# Patient Record
Sex: Male | Born: 1985 | Race: Black or African American | Hispanic: No | Smoking: Never smoker
Health system: Southern US, Community
[De-identification: ages and names within clinical notes are randomized; demographics above are authoritative.]

## PROBLEM LIST (undated history)

## (undated) DIAGNOSIS — F329 Major depressive disorder, single episode, unspecified: Secondary | ICD-10-CM

## (undated) DIAGNOSIS — F32A Depression, unspecified: Secondary | ICD-10-CM

## (undated) DIAGNOSIS — J302 Other seasonal allergic rhinitis: Secondary | ICD-10-CM

## (undated) DIAGNOSIS — F419 Anxiety disorder, unspecified: Secondary | ICD-10-CM

---
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
## 2004-04-05 ENCOUNTER — Emergency Department (HOSPITAL_COMMUNITY): Admission: EM | Admit: 2004-04-05 | Discharge: 2004-04-05 | Payer: Self-pay | Admitting: Emergency Medicine

## 2004-08-27 ENCOUNTER — Emergency Department (HOSPITAL_COMMUNITY): Admission: EM | Admit: 2004-08-27 | Discharge: 2004-08-27 | Payer: Self-pay | Admitting: Emergency Medicine

## 2004-10-05 ENCOUNTER — Emergency Department (HOSPITAL_COMMUNITY): Admission: EM | Admit: 2004-10-05 | Discharge: 2004-10-05 | Payer: Self-pay | Admitting: Emergency Medicine

## 2004-11-12 ENCOUNTER — Emergency Department (HOSPITAL_COMMUNITY): Admission: EM | Admit: 2004-11-12 | Discharge: 2004-11-12 | Payer: Self-pay | Admitting: Emergency Medicine

## 2004-11-16 ENCOUNTER — Ambulatory Visit (HOSPITAL_COMMUNITY): Admission: RE | Admit: 2004-11-16 | Discharge: 2004-11-16 | Payer: Self-pay | Admitting: Orthopedic Surgery

## 2004-11-16 ENCOUNTER — Ambulatory Visit (HOSPITAL_BASED_OUTPATIENT_CLINIC_OR_DEPARTMENT_OTHER): Admission: RE | Admit: 2004-11-16 | Discharge: 2004-11-16 | Payer: Self-pay | Admitting: Orthopedic Surgery

## 2004-11-23 ENCOUNTER — Emergency Department (HOSPITAL_COMMUNITY): Admission: EM | Admit: 2004-11-23 | Discharge: 2004-11-23 | Payer: Self-pay | Admitting: *Deleted

## 2010-03-09 DEATH — deceased

## 2011-04-12 ENCOUNTER — Emergency Department (HOSPITAL_COMMUNITY): Payer: Self-pay

## 2011-04-12 ENCOUNTER — Encounter: Payer: Self-pay | Admitting: Emergency Medicine

## 2011-04-12 ENCOUNTER — Emergency Department (HOSPITAL_COMMUNITY)
Admission: EM | Admit: 2011-04-12 | Discharge: 2011-04-12 | Disposition: A | Discharge status: Home / Self Care | Payer: Self-pay | Attending: Emergency Medicine | Admitting: Emergency Medicine

## 2011-04-12 DIAGNOSIS — R059 Cough, unspecified: Secondary | ICD-10-CM | POA: Insufficient documentation

## 2011-04-12 DIAGNOSIS — R0602 Shortness of breath: Secondary | ICD-10-CM | POA: Insufficient documentation

## 2011-04-12 DIAGNOSIS — J45901 Unspecified asthma with (acute) exacerbation: Secondary | ICD-10-CM | POA: Insufficient documentation

## 2011-04-12 DIAGNOSIS — R509 Fever, unspecified: Secondary | ICD-10-CM | POA: Insufficient documentation

## 2011-04-12 DIAGNOSIS — J40 Bronchitis, not specified as acute or chronic: Secondary | ICD-10-CM | POA: Insufficient documentation

## 2011-04-12 DIAGNOSIS — R05 Cough: Secondary | ICD-10-CM | POA: Insufficient documentation

## 2011-04-12 MED ORDER — PREDNISONE 20 MG PO TABS
60.0000 mg | ORAL_TABLET | Freq: Once | ORAL | Status: AC
  Administered 2011-04-12: 60 mg via ORAL
  Filled 2011-04-12: qty 3

## 2011-04-12 MED ORDER — AZITHROMYCIN 250 MG PO TABS: ORAL_TABLET | ORAL | Status: DC

## 2011-04-12 MED ORDER — AZITHROMYCIN 250 MG PO TABS
500.0000 mg | ORAL_TABLET | Freq: Once | ORAL | Status: AC
  Administered 2011-04-12: 500 mg via ORAL
  Filled 2011-04-12: qty 2

## 2011-04-12 MED ORDER — PREDNISONE 20 MG PO TABS: 40.0000 mg | ORAL_TABLET | Freq: Every day | ORAL | Status: DC

## 2011-04-12 MED ORDER — IPRATROPIUM BROMIDE 0.02 % IN SOLN
0.5000 mg | Freq: Once | RESPIRATORY_TRACT | Status: AC
  Administered 2011-04-12: 0.5 mg via RESPIRATORY_TRACT
  Filled 2011-04-12: qty 2.5

## 2011-04-12 MED ORDER — ALBUTEROL SULFATE (5 MG/ML) 0.5% IN NEBU
5.0000 mg | INHALATION_SOLUTION | Freq: Once | RESPIRATORY_TRACT | Status: AC
  Administered 2011-04-12: 5 mg via RESPIRATORY_TRACT
  Filled 2011-04-12: qty 1

## 2011-04-12 NOTE — ED Notes (Signed)
Pt is stable pt states he wants to go to see if he feels better

## 2011-04-12 NOTE — ED Provider Notes (Signed)
History     CSN: [id number]  Arrival date & time 04/12/11  1748   First MD Initiated Contact with Patient 04/12/11 2139      Chief Complaint  Patient presents with  . Asthma    (Consider location/radiation/quality/duration/timing/severity/associated sxs/prior treatment) HPI Comments: Patient here with worsening asthma and cough - denies fever or chills - reports worse with lying flat and exertion - denies chest pain - has been using albuterol inhaler without relief.  Patient is a 26 y.o. male presenting with asthma. The history is provided by the patient. No language interpreter was used.  Asthma This is a new problem. The current episode started in the past 7 days. The problem occurs constantly. The problem has been unchanged. Associated symptoms include congestion and coughing. Pertinent negatives include no abdominal pain, arthralgias, chest pain, chills, diaphoresis, fatigue, fever, myalgias, nausea, neck pain, numbness, rash, sore throat, swollen glands, visual change, vomiting or weakness. The symptoms are aggravated by nothing. He has tried nothing for the symptoms. The treatment provided no relief.    Past Medical History  Diagnosis Date  . Asthma     Past Surgical History  Procedure Date  . Appendectomy     Family History  Problem Relation Age of Onset  . Diabetes Other   . Asthma Other     History  Substance Use Topics  . Smoking status: Never Smoker   . Smokeless tobacco: Not on file  . Alcohol Use: No      Review of Systems  Constitutional: Negative for fever, chills, diaphoresis and fatigue.  HENT: Positive for congestion. Negative for sore throat and neck pain.   Respiratory: Positive for cough.   Cardiovascular: Negative for chest pain.  Gastrointestinal: Negative for nausea, vomiting and abdominal pain.  Musculoskeletal: Negative for myalgias and arthralgias.  Skin: Negative for rash.  Neurological: Negative for weakness and numbness.  All other  systems reviewed and are negative.    Allergies  Shellfish allergy  Home Medications   Current Outpatient Rx  Name Route Sig Dispense Refill  . ALBUTEROL SULFATE HFA 108 (90 BASE) MCG/ACT IN AERS Inhalation Inhale 2 puffs into the lungs every 6 (six) hours as needed. For shortness of breath.       BP 120/79  Pulse 88  Temp(Src) 98.8 F (37.1 C) (Oral)  Resp 20  Wt 170 lb (77.111 kg)  SpO2 96%  Physical Exam  Nursing note and vitals reviewed. Constitutional: He is oriented to person, place, and time. He appears well-developed and well-nourished. No distress.  HENT:  Head: Normocephalic and atraumatic.  Right Ear: External ear normal.  Left Ear: External ear normal.  Nose: Nose normal.  Mouth/Throat: Oropharynx is clear and moist. No oropharyngeal exudate.  Eyes: Conjunctivae are normal. Pupils are equal, round, and reactive to light. No scleral icterus.  Neck: Normal range of motion. Neck supple.  Cardiovascular: Normal rate, regular rhythm and normal heart sounds.  Exam reveals no gallop and no friction rub.   No murmur heard. Pulmonary/Chest: Effort normal and breath sounds normal. No respiratory distress. He has no wheezes. He has no rales. He exhibits no tenderness.       Prolonged expiration  Abdominal: Soft. Bowel sounds are normal. He exhibits no distension. There is no tenderness.  Musculoskeletal: Normal range of motion.  Lymphadenopathy:    He has no cervical adenopathy.  Neurological: He is alert and oriented to person, place, and time. He exhibits normal muscle tone.  Skin: Skin is warm  and dry.  Psychiatric: He has a normal mood and affect. His behavior is normal. Judgment and thought content normal.    ED Course  Procedures (including critical care time)  Labs Reviewed - No data to display Dg Chest 2 View  04/12/2011  *RADIOLOGY REPORT*  Clinical Data: Cough, fever, shortness of breath.  CHEST - 2 VIEW  Comparison: None  Findings: Diffuse peribronchial  thickening. Heart and mediastinal contours are within normal limits.  No focal opacities or effusions.  No acute bony abnormality.  IMPRESSION: Bronchitic changes.  Original Report Authenticated By: Cyndie Chime, M.D.     Bronchitis Asthma exacerbation    MDM  Mild improvement in symptoms after breathing treatment - noted with bronchitis on x-ray - will treat for same - also started on prednisone - good air movement and no wheezing noted after treatment.        Izola Price [place], [place] 04/12/11 2252

## 2011-04-12 NOTE — ED Notes (Signed)
Pt states he has been having problems with his asthma for the past 3 days  Pt states as long as he is at rest he is fine but any kind of exertion causes him to become short of breath and he starts coughing  Pt states he cannot lay flat or it is hard to breathe  Pt states he has been using his albuterol inhaler without relief

## 2011-04-13 NOTE — ED Provider Notes (Signed)
Medical screening examination/treatment/procedure(s) were performed by non-physician practitioner and as supervising physician I was immediately available for consultation/collaboration.  Dione Booze, MD 04/13/11 [phone]

## 2011-04-16 ENCOUNTER — Encounter (HOSPITAL_COMMUNITY): Payer: Self-pay

## 2011-04-16 ENCOUNTER — Emergency Department (HOSPITAL_COMMUNITY)
Admission: EM | Admit: 2011-04-16 | Discharge: 2011-04-16 | Disposition: A | Discharge status: Home / Self Care | Payer: Self-pay | Attending: Emergency Medicine | Admitting: Emergency Medicine

## 2011-04-16 DIAGNOSIS — R059 Cough, unspecified: Secondary | ICD-10-CM | POA: Insufficient documentation

## 2011-04-16 DIAGNOSIS — J45901 Unspecified asthma with (acute) exacerbation: Secondary | ICD-10-CM | POA: Insufficient documentation

## 2011-04-16 DIAGNOSIS — R0981 Nasal congestion: Secondary | ICD-10-CM

## 2011-04-16 DIAGNOSIS — J3489 Other specified disorders of nose and nasal sinuses: Secondary | ICD-10-CM | POA: Insufficient documentation

## 2011-04-16 DIAGNOSIS — R05 Cough: Secondary | ICD-10-CM | POA: Insufficient documentation

## 2011-04-16 MED ORDER — FLUTICASONE PROPIONATE 50 MCG/ACT NA SUSP: 2.0000 | Freq: Every day | NASAL | Status: DC

## 2011-04-16 MED ORDER — PSEUDOEPHEDRINE-IBUPROFEN 30-200 MG PO TABS: ORAL_TABLET | ORAL | Status: DC

## 2011-04-16 NOTE — ED Notes (Signed)
Pt was seen on the 3rd for the same sx, no relief with medications

## 2011-04-16 NOTE — ED Provider Notes (Signed)
Medical screening examination/treatment/procedure(s) were performed by non-physician practitioner and as supervising physician I was immediately available for consultation/collaboration.  Cyndra Numbers, MD 04/16/11 [phone]

## 2011-04-16 NOTE — ED Provider Notes (Signed)
History     CSN: [id number]  Arrival date & time 04/16/11  1232   None     Chief Complaint  Patient presents with  . Bronchitis    (Consider location/radiation/quality/duration/timing/severity/associated sxs/prior treatment) HPI  Patient presents to ER complaining of a 3 week hx of cough and asthma flare as well as nasal congestion stating he was seen in the ER prior and treated with prednisone, albuterol and Zpack stating he had temporary relief of symptoms but states that over the last few days increasing nasal congestion stating that when he lies down flat he feels like "no nose stops up closed." patient stating ongoing waxing and waning cough and wheeze with relief of wheezing with albuterol inhaler use. Denies changing or symptoms. Symptoms are persistent and unchanging. Denies fevers, chills, HA, sore throat, CP, abdominal pain, n/v/d.    Past Medical History  Diagnosis Date  . Asthma     Past Surgical History  Procedure Date  . Appendectomy     Family History  Problem Relation Age of Onset  . Diabetes Other   . Asthma Other     History  Substance Use Topics  . Smoking status: Never Smoker   . Smokeless tobacco: Not on file  . Alcohol Use: No      Review of Systems  All other systems reviewed and are negative.    Allergies  Shellfish allergy  Home Medications   Current Outpatient Rx  Name Route Sig Dispense Refill  . ALBUTEROL SULFATE HFA 108 (90 BASE) MCG/ACT IN AERS Inhalation Inhale 2 puffs into the lungs every 6 (six) hours as needed. For shortness of breath.     . AZITHROMYCIN 250 MG PO TABS  1 tablet po daily x 4 days 4 each 0  . FLUTICASONE PROPIONATE 50 MCG/ACT NA SUSP Nasal Place 2 sprays into the nose daily. 16 g 2  . PREDNISONE 20 MG PO TABS Oral Take 2 tablets (40 mg total) by mouth daily. 10 tablet 0  . PSEUDOEPHEDRINE-IBUPROFEN 30-200 MG PO TABS  Take as directed by Over the Counter instructions. 84 each 0    BP 117/75  Pulse 92   Temp(Src) 98.4 F (36.9 C) (Oral)  Resp 16  SpO2 98%  Physical Exam  Vitals reviewed. Constitutional: He is oriented to person, place, and time. He appears well-developed and well-nourished. No distress.  HENT:  Head: Normocephalic and atraumatic.  Right Ear: External ear normal.  Left Ear: External ear normal.  Nose: Nose normal.  Mouth/Throat: No oropharyngeal exudate.       Mild erythema of posterior pharynx and tonsils no tonsillar exudate or enlargement. Patent airway. Swallowing secretions well  Boggy nasal mucosa bilaterally.  Eyes: Conjunctivae and EOM are normal. Pupils are equal, round, and reactive to light.  Neck: Normal range of motion. Neck supple.  Cardiovascular: Normal rate, regular rhythm and normal heart sounds.  Exam reveals no gallop and no friction rub.   No murmur heard. Pulmonary/Chest: Effort normal and breath sounds normal. No respiratory distress. He has no wheezes. He has no rales. He exhibits no tenderness.  Abdominal: Soft. He exhibits no distension and no mass. There is no tenderness. There is no rebound and no guarding.  Lymphadenopathy:    He has no cervical adenopathy.  Neurological: He is alert and oriented to person, place, and time. He has normal reflexes.  Skin: Skin is warm and dry. No rash noted. He is not diaphoretic.  Psychiatric: He has a normal mood and  affect.    ED Course  Procedures (including critical care time)  Labs Reviewed - No data to display No results found.   1. Nasal congestion   2. Asthma       MDM  Patient describing "stopped up nose" causing SOB but denies CP or difficulty breathing with normal lung exam and pulse ox 98% on room air. Chest xray from start of symptoms three weeks ago shows no PNA or acute findings.         [place], [place] 04/16/11 1521

## 2011-12-22 ENCOUNTER — Emergency Department (HOSPITAL_COMMUNITY)
Admission: EM | Admit: 2011-12-22 | Discharge: 2011-12-22 | Disposition: A | Discharge status: Home Health | Payer: No Typology Code available for payment source | Attending: Emergency Medicine | Admitting: Emergency Medicine

## 2011-12-22 ENCOUNTER — Emergency Department (HOSPITAL_COMMUNITY): Payer: No Typology Code available for payment source

## 2011-12-22 DIAGNOSIS — Z043 Encounter for examination and observation following other accident: Secondary | ICD-10-CM | POA: Insufficient documentation

## 2011-12-22 DIAGNOSIS — M25539 Pain in unspecified wrist: Secondary | ICD-10-CM | POA: Insufficient documentation

## 2011-12-22 DIAGNOSIS — M25531 Pain in right wrist: Secondary | ICD-10-CM

## 2011-12-22 DIAGNOSIS — J45909 Unspecified asthma, uncomplicated: Secondary | ICD-10-CM | POA: Insufficient documentation

## 2011-12-22 MED ORDER — IBUPROFEN 800 MG PO TABS: 800.0000 mg | ORAL_TABLET | Freq: Three times a day (TID) | ORAL | Status: AC

## 2011-12-22 MED ORDER — OXYCODONE-ACETAMINOPHEN 5-325 MG PO TABS: 1.0000 | ORAL_TABLET | Freq: Four times a day (QID) | ORAL | Status: DC | PRN

## 2011-12-22 NOTE — ED Notes (Signed)
Pt given paper scrub top to wear.  Pt instructed to follow up with ortho in 2 days
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
## 2011-12-22 NOTE — Progress Notes (Signed)
Orthopedic Tech Progress Note Patient Details:  Maurice Koch 1986-03-04 [id number]  Ortho Devices Type of Ortho Device: Thumb spica splint Splint Material: Plaster Ortho Device/Splint Location: right hand Ortho Device/Splint Interventions: Application   Nikki Dom 12/22/2011, 5:49 PM

## 2011-12-22 NOTE — ED Notes (Signed)
ccollar and back board removed by provider.  Right arm is splinted from EMS. Ice pack applied and arm elevated on pillows. Family member at bedside.  Assisted pt with phone call to mother.  Checked on pt fiance in other room for patient.
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
## 2011-12-22 NOTE — ED Notes (Signed)
Pt given of fentanyl by EMS pta

## 2011-12-24 NOTE — ED Provider Notes (Signed)
Medical screening examination/treatment/procedure(s) were conducted as a shared visit with non-physician practitioner(s) and myself.  I personally evaluated the patient during the encounter.  Pt examined.  Noted snuff box point tenderness on exam.  No corresponding fracture noted on imaging.  Splint placed and pt instructed to f/u with an orthopedic specialist.  Exam concerning for occult fracture.  Tobin Chad, MD 12/24/11 [phone]

## 2011-12-25 ENCOUNTER — Emergency Department (HOSPITAL_COMMUNITY)
Admission: EM | Admit: 2011-12-25 | Discharge: 2011-12-25 | Disposition: A | Discharge status: Home / Self Care | Payer: No Typology Code available for payment source | Attending: Emergency Medicine | Admitting: Emergency Medicine

## 2011-12-25 ENCOUNTER — Encounter (HOSPITAL_COMMUNITY): Payer: Self-pay | Admitting: *Deleted

## 2011-12-25 DIAGNOSIS — J45909 Unspecified asthma, uncomplicated: Secondary | ICD-10-CM | POA: Insufficient documentation

## 2011-12-25 DIAGNOSIS — Z833 Family history of diabetes mellitus: Secondary | ICD-10-CM | POA: Insufficient documentation

## 2011-12-25 DIAGNOSIS — M25539 Pain in unspecified wrist: Secondary | ICD-10-CM | POA: Insufficient documentation

## 2011-12-25 DIAGNOSIS — M25531 Pain in right wrist: Secondary | ICD-10-CM

## 2011-12-25 DIAGNOSIS — Z825 Family history of asthma and other chronic lower respiratory diseases: Secondary | ICD-10-CM | POA: Insufficient documentation

## 2011-12-25 DIAGNOSIS — Z91013 Allergy to seafood: Secondary | ICD-10-CM | POA: Insufficient documentation

## 2011-12-25 MED ORDER — OXYCODONE-ACETAMINOPHEN 7.5-500 MG PO TABS: 1.0000 | ORAL_TABLET | Freq: Four times a day (QID) | ORAL | Status: DC | PRN

## 2011-12-25 NOTE — ED Notes (Signed)
CSM intact. Reports last last took one percocet at 0300. States has an appt this Thursday with ortho MD
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
## 2011-12-26 NOTE — ED Provider Notes (Signed)
I saw and evaluated the patient, reviewed the resident's note and I agree with the findings and plan. Please see separate ED provider note.   Dione Booze, MD 12/26/11 [phone]

## 2012-07-13 ENCOUNTER — Emergency Department (HOSPITAL_COMMUNITY)
Admission: EM | Admit: 2012-07-13 | Discharge: 2012-07-13 | Disposition: A | Discharge status: Home / Self Care | Payer: Self-pay | Attending: Emergency Medicine | Admitting: Emergency Medicine

## 2012-07-13 ENCOUNTER — Encounter (HOSPITAL_COMMUNITY): Payer: Self-pay | Admitting: Emergency Medicine

## 2012-07-13 DIAGNOSIS — M79641 Pain in right hand: Secondary | ICD-10-CM

## 2012-07-13 DIAGNOSIS — M549 Dorsalgia, unspecified: Secondary | ICD-10-CM

## 2012-07-13 DIAGNOSIS — J45909 Unspecified asthma, uncomplicated: Secondary | ICD-10-CM | POA: Insufficient documentation

## 2012-07-13 DIAGNOSIS — Z79899 Other long term (current) drug therapy: Secondary | ICD-10-CM | POA: Insufficient documentation

## 2012-07-13 DIAGNOSIS — M545 Low back pain, unspecified: Secondary | ICD-10-CM | POA: Insufficient documentation

## 2012-07-13 DIAGNOSIS — M79609 Pain in unspecified limb: Secondary | ICD-10-CM | POA: Insufficient documentation

## 2012-07-13 MED ORDER — NAPROXEN 500 MG PO TABS: 500.0000 mg | ORAL_TABLET | Freq: Two times a day (BID) | ORAL | Status: DC

## 2012-07-13 MED ORDER — HYDROCODONE-ACETAMINOPHEN 5-325 MG PO TABS: 1.0000 | ORAL_TABLET | ORAL | Status: DC | PRN
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
## 2012-07-14 NOTE — ED Provider Notes (Signed)
Medical screening examination/treatment/procedure(s) were performed by non-physician practitioner and as supervising physician I was immediately available for consultation/collaboration.   Carleene Cooper III, MD 07/14/12 1247

## 2012-11-03 ENCOUNTER — Encounter (HOSPITAL_COMMUNITY): Payer: Self-pay | Admitting: Emergency Medicine

## 2012-11-03 ENCOUNTER — Emergency Department (HOSPITAL_COMMUNITY)
Admission: EM | Admit: 2012-11-03 | Discharge: 2012-11-03 | Disposition: A | Discharge status: Home / Self Care | Payer: BC Managed Care – PPO | Attending: Emergency Medicine | Admitting: Emergency Medicine

## 2012-11-03 DIAGNOSIS — M545 Low back pain, unspecified: Secondary | ICD-10-CM | POA: Insufficient documentation

## 2012-11-03 DIAGNOSIS — Z79899 Other long term (current) drug therapy: Secondary | ICD-10-CM | POA: Insufficient documentation

## 2012-11-03 DIAGNOSIS — M549 Dorsalgia, unspecified: Secondary | ICD-10-CM

## 2012-11-03 DIAGNOSIS — J45909 Unspecified asthma, uncomplicated: Secondary | ICD-10-CM | POA: Insufficient documentation

## 2012-11-03 MED ORDER — KETOROLAC TROMETHAMINE 60 MG/2ML IM SOLN
60.0000 mg | Freq: Once | INTRAMUSCULAR | Status: AC
  Administered 2012-11-03: 60 mg via INTRAMUSCULAR
  Filled 2012-11-03: qty 2

## 2012-11-03 MED ORDER — OXYCODONE-ACETAMINOPHEN 5-325 MG PO TABS: 1.0000 | ORAL_TABLET | ORAL | Status: DC | PRN

## 2012-11-03 MED ORDER — DEXAMETHASONE SODIUM PHOSPHATE 10 MG/ML IJ SOLN
10.0000 mg | Freq: Once | INTRAMUSCULAR | Status: AC
  Administered 2012-11-03: 10 mg via INTRAMUSCULAR
  Filled 2012-11-03: qty 1

## 2012-11-03 MED ORDER — MELOXICAM 15 MG PO TABS: 15.0000 mg | ORAL_TABLET | Freq: Every day | ORAL | Status: DC
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
## 2012-11-04 NOTE — ED Provider Notes (Signed)
Medical screening examination/treatment/procedure(s) were performed by non-physician practitioner and as supervising physician I was immediately available for consultation/collaboration.  Geoffery Lyons, MD 11/04/12 [phone]

## 2012-12-12 ENCOUNTER — Emergency Department (HOSPITAL_COMMUNITY)
Admission: EM | Admit: 2012-12-12 | Discharge: 2012-12-12 | Disposition: A | Discharge status: Home Health | Payer: BC Managed Care – PPO | Attending: Emergency Medicine | Admitting: Emergency Medicine

## 2012-12-12 ENCOUNTER — Encounter (HOSPITAL_COMMUNITY): Payer: Self-pay

## 2012-12-12 DIAGNOSIS — J45909 Unspecified asthma, uncomplicated: Secondary | ICD-10-CM | POA: Insufficient documentation

## 2012-12-12 DIAGNOSIS — M545 Low back pain, unspecified: Secondary | ICD-10-CM | POA: Insufficient documentation

## 2012-12-12 DIAGNOSIS — Z79899 Other long term (current) drug therapy: Secondary | ICD-10-CM | POA: Insufficient documentation

## 2012-12-12 MED ORDER — METHOCARBAMOL 500 MG PO TABS: 500.0000 mg | ORAL_TABLET | Freq: Two times a day (BID) | ORAL | Status: DC

## 2012-12-12 MED ORDER — TRAMADOL HCL 50 MG PO TABS: 50.0000 mg | ORAL_TABLET | Freq: Four times a day (QID) | ORAL | Status: DC | PRN
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
## 2012-12-14 NOTE — ED Provider Notes (Signed)
Medical screening examination/treatment/procedure(s) were performed by non-physician practitioner and as supervising physician I was immediately available for consultation/collaboration.  Bram Hottel T Teriann Livingood, MD 12/14/12 2049 

## 2012-12-30 ENCOUNTER — Emergency Department (INDEPENDENT_AMBULATORY_CARE_PROVIDER_SITE_OTHER)
Admission: EM | Admit: 2012-12-30 | Discharge: 2012-12-30 | Disposition: A | Discharge status: Home / Self Care | Payer: BC Managed Care – PPO | Source: Home / Self Care | Attending: Family Medicine | Admitting: Family Medicine

## 2012-12-30 ENCOUNTER — Encounter (HOSPITAL_COMMUNITY): Payer: Self-pay | Admitting: Emergency Medicine

## 2012-12-30 DIAGNOSIS — S39012A Strain of muscle, fascia and tendon of lower back, initial encounter: Secondary | ICD-10-CM

## 2012-12-30 DIAGNOSIS — S335XXA Sprain of ligaments of lumbar spine, initial encounter: Secondary | ICD-10-CM

## 2012-12-30 MED ORDER — CYCLOBENZAPRINE HCL 5 MG PO TABS: 5.0000 mg | ORAL_TABLET | Freq: Three times a day (TID) | ORAL | Status: DC | PRN

## 2012-12-30 MED ORDER — DICLOFENAC POTASSIUM 50 MG PO TABS: 50.0000 mg | ORAL_TABLET | Freq: Three times a day (TID) | ORAL | Status: DC

## 2012-12-30 NOTE — ED Notes (Signed)
C/o lower back pain. States 2 days ago picking up case of water felt sharp pain shooting up back and felt in in pelvic area. Pt has tried tylenol with no relief.
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
## 2013-05-16 ENCOUNTER — Emergency Department (HOSPITAL_COMMUNITY)
Admission: EM | Admit: 2013-05-16 | Discharge: 2013-05-16 | Disposition: A | Discharge status: Home / Self Care | Payer: BC Managed Care – PPO | Attending: Emergency Medicine | Admitting: Emergency Medicine

## 2013-05-16 ENCOUNTER — Emergency Department (HOSPITAL_COMMUNITY): Payer: BC Managed Care – PPO

## 2013-05-16 ENCOUNTER — Encounter (HOSPITAL_COMMUNITY): Payer: Self-pay | Admitting: Emergency Medicine

## 2013-05-16 DIAGNOSIS — Z79899 Other long term (current) drug therapy: Secondary | ICD-10-CM | POA: Insufficient documentation

## 2013-05-16 DIAGNOSIS — J45901 Unspecified asthma with (acute) exacerbation: Secondary | ICD-10-CM | POA: Insufficient documentation

## 2013-05-16 DIAGNOSIS — R079 Chest pain, unspecified: Secondary | ICD-10-CM | POA: Insufficient documentation

## 2013-05-16 DIAGNOSIS — IMO0002 Reserved for concepts with insufficient information to code with codable children: Secondary | ICD-10-CM | POA: Insufficient documentation

## 2013-05-16 LAB — BASIC METABOLIC PANEL
BUN: 10 mg/dL (ref 6–23)
CHLORIDE: 100 meq/L (ref 96–112)
CO2: 24 meq/L (ref 19–32)
CREATININE: 0.96 mg/dL (ref 0.50–1.35)
Calcium: 9.1 mg/dL (ref 8.4–10.5)
GFR calc Af Amer: >90 mL/min (ref >90)
GFR calc non Af Amer: >90 mL/min (ref >90)
Glucose, Bld: 96 mg/dL (ref 70–99)
POTASSIUM: 3.5 meq/L — AB (ref 3.7–5.3)
Sodium: 139 mEq/L (ref 137–147)

## 2013-05-16 LAB — CBC
HEMATOCRIT: 42.8 % (ref 39.0–52.0)
Hemoglobin: 14.4 g/dL (ref 13.0–17.0)
MCH: 26.7 pg (ref 26.0–34.0)
MCHC: 33.6 g/dL (ref 30.0–36.0)
MCV: 79.4 fL (ref 78.0–100.0)
PLATELETS: 232 10*3/uL (ref 150–400)
RBC: 5.39 MIL/uL (ref 4.22–5.81)
RDW: 14.2 % (ref 11.5–15.5)
WBC: 9.3 10*3/uL (ref 4.0–10.5)

## 2013-05-16 MED ORDER — HYDROCODONE-ACETAMINOPHEN 5-325 MG PO TABS: ORAL_TABLET | ORAL | Status: DC

## 2013-05-16 MED ORDER — ALBUTEROL SULFATE (2.5 MG/3ML) 0.083% IN NEBU: 2.5000 mg | INHALATION_SOLUTION | Freq: Four times a day (QID) | RESPIRATORY_TRACT | Status: DC | PRN

## 2013-05-16 MED ORDER — IPRATROPIUM BROMIDE 0.02 % IN SOLN
0.5000 mg | Freq: Once | RESPIRATORY_TRACT | Status: AC
  Administered 2013-05-16: 0.5 mg via RESPIRATORY_TRACT
  Filled 2013-05-16: qty 2.5

## 2013-05-16 MED ORDER — ALBUTEROL SULFATE (2.5 MG/3ML) 0.083% IN NEBU
INHALATION_SOLUTION | RESPIRATORY_TRACT | Status: AC
  Administered 2013-05-16: 5 mg via RESPIRATORY_TRACT
  Filled 2013-05-16: qty 6

## 2013-05-16 MED ORDER — ALBUTEROL SULFATE (2.5 MG/3ML) 0.083% IN NEBU
5.0000 mg | INHALATION_SOLUTION | Freq: Once | RESPIRATORY_TRACT | Status: AC
  Administered 2013-05-16: 5 mg via RESPIRATORY_TRACT

## 2013-05-16 MED ORDER — PREDNISONE 20 MG PO TABS
60.0000 mg | ORAL_TABLET | Freq: Once | ORAL | Status: AC
  Administered 2013-05-16: 60 mg via ORAL
  Filled 2013-05-16: qty 3

## 2013-05-16 MED ORDER — ALBUTEROL (5 MG/ML) CONTINUOUS INHALATION SOLN
10.0000 mg/h | INHALATION_SOLUTION | RESPIRATORY_TRACT | Status: DC
  Administered 2013-05-16: 10 mg/h via RESPIRATORY_TRACT
  Filled 2013-05-16: qty 20

## 2013-05-16 MED ORDER — AZITHROMYCIN 250 MG PO TABS: 250.0000 mg | ORAL_TABLET | Freq: Every day | ORAL | Status: DC

## 2013-05-16 MED ORDER — PREDNISONE 20 MG PO TABS: 40.0000 mg | ORAL_TABLET | Freq: Every day | ORAL | Status: DC

## 2013-05-16 MED ORDER — ALBUTEROL SULFATE HFA 108 (90 BASE) MCG/ACT IN AERS
2.0000 | INHALATION_SPRAY | RESPIRATORY_TRACT | Status: DC | PRN
  Administered 2013-05-16: 2 via RESPIRATORY_TRACT
  Filled 2013-05-16: qty 6.7

## 2013-05-16 NOTE — Discharge Instructions (Signed)
Asthma, Adult Asthma is a condition of the lungs in which the airways tighten and narrow. Asthma can make it hard to breathe. Asthma cannot be cured, but medicine and lifestyle changes can help control it. Asthma may be started (triggered) by:  Animal skin flakes (dander).  Dust.  Cockroaches.  Pollen.  Mold.  Smoke.  Cleaning products.  Hair sprays or aerosol sprays.  Paint fumes or strong smells.  Cold air, weather changes, and winds.  Crying or laughing hard.  Stress.  Certain medicines or drugs.  Foods, such as dried fruit, potato chips, and sparkling grape juice.  Infections or conditions (colds, flu).  Exercise.  Certain medical conditions or diseases.  Exercise or tiring activities. HOME CARE   Take medicine as told by your doctor.  Use a peak flow meter as told by your doctor. A peak flow meter is a tool that measures how well the lungs are working.  Record and keep track of the peak flow meter's readings.  Understand and use the asthma action plan. An asthma action plan is a written plan for taking care of your asthma and treating your attacks.  To help prevent asthma attacks:  Do not smoke. Stay away from secondhand smoke.  Change your heating and air conditioning filter often.  Limit your use of fireplaces and wood stoves.  Get rid of pests (such as roaches and mice) and their droppings.  Throw away plants if you see mold on them.  Clean your floors. Dust regularly. Use cleaning products that do not smell.  Have someone vacuum when you are not home. Use a vacuum cleaner with a HEPA filter if possible.  Replace carpet with wood, tile, or vinyl flooring. Carpet can trap animal skin flakes and dust.  Use allergy-proof pillows, mattress covers, and box spring covers.  Wash bed sheets and blankets every week in hot water and dry them in a dryer.  Use blankets that are made of polyester or cotton.  Clean bathrooms and kitchens with bleach.  If possible, have someone repaint the walls in these rooms with mold-resistant paint. Keep out of the rooms that are being cleaned and painted.  Wash hands often. GET HELP IF:  You have make a whistling sound when breaking (wheeze), have shortness of breath, or have a cough even if taking medicine to prevent attacks.  The colored mucus you cough up (sputum) is thicker than usual.  The colored mucus you cough up changes from clear or white to yellow, green, gray, or bloody.  You have problems from the medicine you are taking such as:  A rash.  Itching.  Swelling.  Trouble breathing.  You need reliever medicines more than 2 3 times a week.  Your peak flow measurement is still at 50 79% of your personal best after following the action plan for 1 hour. GET HELP RIGHT AWAY IF:   You seem to be worse and are not responding to medicine during an asthma attack.  You are short of breath even at rest.  You get short of breath when doing very little activity.  You have trouble eating, drinking, or talking.  You have chest pain.  You have a fast heartbeat.  Your lips or fingernails start to turn blue.  You are lightheaded, dizzy, or faint.  Your peak flow is less than 50% of your personal best.  You have a fever or lasting symptoms for more than 2 3 days.  You have a fever and your symptoms suddenly  get worse. MAKE SURE YOU:   Understand these instructions.  Will watch your condition.  Will get help right away if you are not doing well or get worse. Document Released: 09/12/2007 Document Revised: 01/14/2013 Document Reviewed: 10/23/2012 [facility] Patient Information 2014 [place], [place].  Bronchospasm, Adult A bronchospasm is when the tubes that carry air in and out of your lungs (airwarys) spasm or tighten. During a bronchospasm it is hard to breathe. This is because the airways get smaller. A bronchospasm can be triggered by:  Allergies. These may be to animals,  pollen, food, or mold.  Infection. This is a common cause of bronchospasm.  Exercise.  Irritants. These include pollution, cigarette smoke, strong odors, aerosol sprays, and paint fumes.  Weather changes.  Stress.  Being emotional. HOME CARE   Always have a plan for getting help. Know when to call your doctor and local emergency services (911 in the U.S.). Know where you can get emergency care.  Only take medicines as told by your doctor.  If you were prescribed an inhaler or nebulizer machine, ask your doctor how to use it correctly. Always use a spacer with your inhaler if you were given one.  Stay calm during an attack. Try to relax and breathe more slowly.  Control your home environment:  Change your heating and air conditioning filter at least once a month.  Limit your use of fireplaces and wood stoves.  Do not  smoke. Do not  allow smoking in your home.  Avoid perfumes and fragrances.  Get rid of pests (such as roaches and mice) and their droppings.  Throw away plants if you see mold on them.  Keep your house clean and dust free.  Replace carpet with wood, tile, or vinyl flooring. Carpet can trap dander and dust.  Use allergy-proof pillows, mattress covers, and box spring covers.  Wash bed sheets and blankets every week in hot water. Dry them in a dryer.  Use blankets that are made of polyester or cotton.  Wash hands frequently. GET HELP IF:  You have muscle aches.  You have chest pain.  The thick spit you spit or cough up (sputum) changes from clear or white to yellow, green, gray, or bloody.  The thick spit you spit or cough up gets thicker.  There are problems that may be related to the medicine you are given such as:  A rash.  Itching.  Swelling.  Trouble breathing. GET HELP RIGHT AWAY IF:  You feel you cannot breathe or catch your breath.  You cannot stop coughing.  Your treatment is not helping you breathe better. MAKE SURE YOU:     Understand these instructions.  Will watch your condition.  Will get help right away if you are not doing well or get worse. Document Released: 01/21/2009 Document Revised: 11/26/2012 Document Reviewed: 09/16/2012 [facility] Patient Information 2014 [place], [place].   Emergency Department Resource Guide 1) Find a Doctor and Pay Out of Pocket Although you won't have to find out who is covered by your insurance plan, it is a good idea to ask around and get recommendations. You will then need to call the office and see if the doctor you have chosen will accept you as a new patient and what types of options they offer for patients who are self-pay. Some doctors offer discounts or will set up payment plans for their patients who do not have insurance, but you will need to ask so you aren't surprised when you get to  your appointment.  2) Contact Your Local Health Department Not all health departments have doctors that can see patients for sick visits, but many do, so it is worth a call to see if yours does. If you don't know where your local health department is, you can check in your phone book. The CDC also has a tool to help you locate your state's health department, and many state websites also have listings of all of their local health departments.  3) Find a Walk-in Clinic If your illness is not likely to be very severe or complicated, you may want to try a walk in clinic. These are popping up all over the country in pharmacies, drugstores, and shopping centers. They're usually staffed by nurse practitioners or physician assistants that have been trained to treat common illnesses and complaints. They're usually fairly quick and inexpensive. However, if you have serious medical issues or chronic medical problems, these are probably not your best option.  No Primary Care Doctor: - Call Health Connect at  [phone] - they can help you locate a primary care doctor that  accepts your insurance,  provides certain services, etc. - Physician Referral Service- [phone]  Chronic Pain Problems: Organization         Address  Phone   Notes  [facility]  [phone] Patients need to be referred by their primary care doctor.   Medication Assistance: Organization         Address  Phone   Notes  [facility] Medication [facility] [address] [place], [place] [address] [phone] --Must be a resident of [facility] -- Must have NO insurance coverage whatsoever (no Medicaid/ Medicare, etc.) -- The pt. MUST have a primary care doctor that directs their care regularly and follows them in the community   [facility]ist  [phone]   [employer]  [phone]    Agencies that provide inexpensive medical care: Organization         Address  Phone   Notes  [facility]  [phone]   [facility]    [phone]   [facility] [address] [place], [place] [address] [phone]   [facility] [address] [place], [place] [phone]   Planned Parenthood    [phone]   [facility]    [phone]   [facility]  [address], [place] Phone:  [phone], Fax:  [phone] Hours of Operation:  9 am - 6 pm, M-F.  Also accepts Medicaid/Medicare and self-pay.  [facility]  [address], [place] Phone: [phone], Fax: [phone]. Hours of Operation:  8:30 am - 5:30 pm, M-F.  Also accepts Medicaid and self-pay.  [facility] [place] [address] [place] Phone: [phone]   [facility] [address] Natasha Bence [place], [place] [phone], Ext. [phone] Mondays & Thursdays: 7-9 AM.  First 15 patients are seen on a first come, first serve basis.    Medicaid-accepting [facility] Providers:  Organization         Address  Phone    Notes  [facility] [address], [place] [phone] Also accepts self-pay patients.  [facility] [address] Laurell Josephs [place], [place]  [phone]   [facility] [phone]  [address], [place] [phone]) [phone]   [facility] [address] [place] [address], [place] [phone]) [phone]   Renaye RakersVeita Bland [address], [place]   [phone]) [phone] Only accepts [place] Access [place] patients after they have their name applied to their card.   Self-Pay (no insurance) in [facility]:  Organization         Address  Phone   Notes  Sickle Cell Patients, [facility] [address], [place] [phone]   [facility] [address], [place] [phone]) [phone]   [facility]  [address], [place] [phone]   [facility]/Dr. Osei-Bonsu  [address] [place] [address], [place] or [phone], [place] [phone] Phone number for both [place] and [place] locations is the same.  [facility] [address], [place] [phone]) [phone]   [facility] [address] [place] [address], [place] or [address] [phone]) [phone]) [phone]   [facility] [address], [place] [phone]) [phone], phone; [phone]) [phone], fax Sees patients 1st and 3rd Saturday of every month.  Must not qualify for public or private insurance (i.e. Medicaid, Medicare, [place] Health Choice, Veterans' Benefits)  Household income should be no more than 200% of the poverty level The clinic cannot treat you if you are pregnant or think you are pregnant  Sexually transmitted diseases are not treated at the clinic.    Dental Care: Organization         Address  Phone  Notes  [facility] [address], [place] [phone])  [phone] Accepts children up to age 29 who are enrolled in [place] or [place] Health Choice; pregnant women with a Medicaid card; and children who have applied for Medicaid or [place] Health Choice, but were declined, whose parents can pay a reduced fee at time of service.  [facility] [place]  [address], [place] [phone]) [phone] Accepts children up to age 29 who are enrolled in [place] or [place] Health Choice; pregnant women with a Medicaid card; and children who have applied for Medicaid or [place] Health Choice, but were declined, whose parents can pay a reduced fee at time of service.  [facility]  [address], [place] [phone]) [phone] Patients are seen by appointment only. Walk-ins are not accepted. [facility] will see patients 29 years of age and older. Monday - Tuesday (8am-5pm) Most Wednesdays (8:30-5pm) $30 per visit, cash only  [facility]  [address], [facility] [place] [phone]) [phone] Patients are seen by appointment only. Walk-ins are not accepted. [facility] will see patients 918 years of age and older. One Wednesday Evening (Monthly: Volunteer Based).  $30 per visit, cash only  [facility]  [phone]) [phone] for adults; Children under age 844, call Graduate Pediatric Dentistry at [phone]. Children aged 634-14, please call [phone]) [phone] to request a pediatric application.  Dental services are provided in all areas of dental care including fillings, crowns and bridges, complete and partial dentures, implants, gum treatment, root canals, and extractions. Preventive care is also provided. Treatment is provided to both adults and children. Patients are selected via a lottery and there is often a waiting list.   [facility] [address], Ginette Otto  [phone] [email or web address]   [facility] [address] [place], [place] [phone], Ext.  [phone] Second and Fourth Thursday of each month, opens at 6:30 AM; Clinic ends at 9 AM.  Patients are seen on a first-come first-served basis, and a limited number are seen during each clinic.   [facility]  [address] Ether Griffins [place], [place] [phone]   Eligibility Requirements You must have lived in [place], [place], or [place] counties for at least the last three months.   You cannot be eligible for state or federal sponsored [place], including [employer], [place], or [employer].   You generally cannot be eligible for healthcare insurance through your employer.    How to apply: Eligibility screenings are held every Tuesday and Wednesday afternoon from 1:00 pm until 4:00 pm. You do not need an appointment for the interview!  [facility] [address], [address], [place] [phone]   [facility] Health Department  [phone]   [facility] Health Department  [phone]   [facility] Health Department  [phone]    Behavioral Health Resources in the Community: Intensive Outpatient Programs Organization         Address  Phone  Notes  [facility] [address] [place], [place] [phone]   [facility] Outpatient [address] [place], [place] [phone]   [facility] [address], [place], [place]  [phone]   [facility] Mental Health [address],  [place], [place] [phone] or [phone]   Substance Abuse Resources Organization         Address  Phone  Notes  [facility] Drug Services  [phone]   [facility]  [phone]   [facility] [place]  [phone]   Floydene Flock  [phone]   Residential & Outpatient Substance Abuse Program  [phone]   Psychological Services Organization         Address  Phone  Notes  [facility]  [phone]   [facility]  [phone]   [facility]  Mental Health [address], [place] [phone] or [phone]    Mobile Crisis Teams Organization         Address  Phone  Notes  [facility], Mobile Crisis Care Unit  [phone]   [facility]  [address] [place], [place] [phone]   Doristine Locks [address] [place] [phone]    Self-Help/Support Groups Organization         Address  Phone             Notes  [facility]oc[facility] of [place] - variety of support groups  336- I7437963 Call for more information  Narcotics Anonymous (NA), [facility] [address], [employer] [place]  2 meetings at this location   Statistician         Address  Phone  Notes  [facility] [address],    [place]  [phone]   [facility]  [address] [place] [address], [place], [place] [phone]   [facility] Treatment Facility [address] [place], [place] [phone] Admissions: 8am-3pm M-F  [facility] [address]    [place], [place] [phone]   [facility] [address] [place], [place], [place] [phone]   [facility] [address]  [place], [place] [phone]  [facility] - Intensive Outpatient [address], [place], [place] [phone]   [facility] ([facility] [address]  [place], [place] [phone] or [phone]   [facility] (RTS) [address] [place], [place] [phone] Accepts Medicaid  [facility] [address]  [place] [phone] Substance Abuse/Addiction Treatment   [facility] Behavioral Health Resources Organization         Address  Phone  Notes  [facility]  [phone]) [phone]   Angie FavaJulie Brannon, PhD [address], [address] [place], [place]   [phone]) [phone] or [phone](336) [phone]   [facility]    [address] [place] [address] [place], [place] [phone](336) [phone]   [facility] [address], [place], [place] [phone]) [phone] Insurance/Medicaid/sponsorship through [facility]  [facility] Families [address]                                    [place], [place] [phone]) [phone] Therapy/tele-psych/case  [facility] [address]   [place], [place] [phone]) [phone]    Dr. Lolly MustacheArfeen  [phone]) [phone]   [facility]  [employer] [facility] 1) [address], [place] 2) [address], [place] 3)  [address], [place] [phone](336) [phone]  [phone]) [phone]   [facility] Child Abuse Hotline [phone]) 7438267291 or [phone](336) [phone] (After Hours)

## 2013-05-16 NOTE — ED Notes (Signed)
PA at BS.  

## 2013-05-16 NOTE — ED Notes (Signed)
MD at bedside. 

## 2013-05-16 NOTE — ED Notes (Signed)
Pt with hx of asthma.  Pt presents with asthma flare and SOB for 2 days.  Wheezing noted.  Pt alert and oriented.

## 2013-05-16 NOTE — ED Provider Notes (Signed)
CSN: [id number]     Arrival date & time 05/16/13  1812 History   First MD Initiated Contact with Patient 05/16/13 1825     Chief Complaint  Patient presents with  . Asthma  . Shortness of Breath  . Chest Pain   (Consider location/radiation/quality/duration/timing/severity/associated sxs/prior Treatment) Patient is a 28 y.o. male presenting with asthma, shortness of breath, and chest pain. The history is provided by the patient.  Asthma This is a chronic problem. The current episode started 2 days ago. The problem occurs constantly. The problem has been gradually worsening. Associated symptoms include chest pain and shortness of breath. Pertinent negatives include no abdominal pain. Nothing aggravates the symptoms. Nothing relieves the symptoms. Treatments tried: albuterol, nebulizers. The treatment provided no relief.  Shortness of Breath Associated symptoms: chest pain and cough   Associated symptoms: no abdominal pain, no fever and no vomiting   Chest Pain Associated symptoms: cough and shortness of breath   Associated symptoms: no abdominal pain, no fever and not vomiting     Past Medical History  Diagnosis Date  . Asthma    Past Surgical History  Procedure Laterality Date  . Appendectomy     Family History  Problem Relation Age of Onset  . Diabetes Other   . Asthma Other    History  Substance Use Topics  . Smoking status: Never Smoker   . Smokeless tobacco: Not on file  . Alcohol Use: No    Review of Systems  Constitutional: Negative for fever.  Respiratory: Positive for cough and shortness of breath.   Cardiovascular: Positive for chest pain.  Gastrointestinal: Negative for vomiting and abdominal pain.  All other systems reviewed and are negative.    Allergies  Shellfish allergy  Home Medications   Current Outpatient Rx  Name  Route  Sig  Dispense  Refill  . albuterol (PROVENTIL HFA;VENTOLIN HFA) 108 (90 BASE) MCG/ACT inhaler   Inhalation   Inhale 2  puffs into the lungs every 6 (six) hours as needed for wheezing or shortness of breath.         Marland Kitchen. albuterol (PROVENTIL) (2.5 MG/3ML) 0.083% nebulizer solution   Nebulization   Take 2.5 mg by nebulization every 6 (six) hours as needed for wheezing.         . budesonide (PULMICORT) 0.25 MG/2ML nebulizer solution   Nebulization   Take 0.25 mg by nebulization 2 (two) times daily.         Marland Kitchen. ipratropium (ATROVENT HFA) 17 MCG/ACT inhaler   Inhalation   Inhale 2 puffs into the lungs every 6 (six) hours as needed for wheezing.          BP 107/65  Pulse 111  Temp(Src) 98.5 F (36.9 C) (Oral)  Resp 24  SpO2 98% Physical Exam  Nursing note and vitals reviewed. Constitutional: He is oriented to person, place, and time. He appears well-developed and well-nourished. He appears distressed (mild respiratory distress).  HENT:  Head: Normocephalic and atraumatic.  Mouth/Throat: No oropharyngeal exudate.  Eyes: EOM are normal. Pupils are equal, round, and reactive to light.  Neck: Normal range of motion. Neck supple.  Cardiovascular: Normal rate and regular rhythm.  Exam reveals no friction rub.   No murmur heard. Pulmonary/Chest: He is in respiratory distress (mild). He has wheezes (diffuse). He has no rales.  Abdominal: He exhibits no distension. There is no tenderness. There is no rebound.  Musculoskeletal: Normal range of motion. He exhibits no edema.  Neurological: He is alert and  oriented to person, place, and time.  Skin: He is not diaphoretic.    ED Course  Procedures (including critical care time) Labs Review Labs Reviewed - No data to display Imaging Review No results found.  EKG Interpretation    Date/Time:  Saturday May 16 2013 18:30:34 EST Ventricular Rate:  114 PR Interval:  178 QRS Duration: 99 QT Interval:  316 QTC Calculation: 435 R Axis:   -82 Text Interpretation:  Sinus tachycardia Markedly posterior QRS axis No prior EKG Confirmed by Gwendolyn Grant  MD,  Mikeyla Music ([id number]) on 05/16/2013 6:38:46 PM            MDM   1. Asthma exacerbation    60M here with SOB. Cough for past 2 days. Hx of asthma, has requiring intubation before secondary to asthma. Patient on breathing treatment initiated by nursing on my exam. Diffuse wheezing noted, talking in hurried sentences. Hour long continuous albuterol nebulizer given, steroids given.  Improving on continuous albuterol. Junius Finner is accepting signout, will likely be discharged.  Dagmar Hait, MD 05/16/13 2031

## 2013-05-16 NOTE — ED Provider Notes (Signed)
8:24 PM Pt signed out by Elwin MochaBlair Walden, MD at shift change.  Pt is to complete 1hr nebulizer treatment, if improved, pt may be discharged home with prednisone and albuterol inhaler.    Pt completed 1hr nebulizer tx.  Pt states he feels like his SOB has improved.  On reexamination, pt still has diffuse expiratory wheeze, however pt states he feels comfortable being discharged home.  Pt requesting pain medication for chest pain as he has not slept well the last few nights due to the pain.  Rx: albuterol inhaler, albuterol nebulizer solution, norco, azythromycin, and prednisone.  Return precautions provided. Advised pt f/u with PCP, resource guide provided. Pt verbalized understanding and agreement with tx plan.   Junius Finnerrin O'Malley, PA-C 05/16/13 2128

## 2013-05-17 NOTE — ED Provider Notes (Signed)
Medical screening examination/treatment/procedure(s) were conducted as a shared visit with non-physician practitioner(s) and myself.  I personally evaluated the patient during the encounter.  EKG Interpretation    Date/Time:  Saturday May 16 2013 18:30:34 EST Ventricular Rate:  114 PR Interval:  178 QRS Duration: 99 QT Interval:  316 QTC Calculation: 435 R Axis:   -82 Text Interpretation:  Sinus tachycardia Markedly posterior QRS axis No prior EKG Confirmed by Gwendolyn GrantWALDEN  MD, Aarini Slee ([id number]) on 05/16/2013 6:38:46 PM             Patient here with SOB. Hx of asthma. Diffuse wheezing on exam, improving on continuous albuterol. Signed out to [employer] AutoErin O'Malley.  Dagmar HaitWilliam Cherrish Vitali, MD 05/17/13 [phone]

## 2014-08-17 ENCOUNTER — Emergency Department (HOSPITAL_COMMUNITY)
Admission: EM | Admit: 2014-08-17 | Discharge: 2014-08-17 | Disposition: A | Discharge status: Home / Self Care | Payer: BLUE CROSS/BLUE SHIELD | Attending: Emergency Medicine | Admitting: Emergency Medicine

## 2014-08-17 ENCOUNTER — Encounter (HOSPITAL_COMMUNITY): Payer: Self-pay | Admitting: *Deleted

## 2014-08-17 ENCOUNTER — Emergency Department (HOSPITAL_COMMUNITY): Payer: BLUE CROSS/BLUE SHIELD

## 2014-08-17 DIAGNOSIS — J029 Acute pharyngitis, unspecified: Secondary | ICD-10-CM | POA: Insufficient documentation

## 2014-08-17 DIAGNOSIS — R1110 Vomiting, unspecified: Secondary | ICD-10-CM | POA: Insufficient documentation

## 2014-08-17 DIAGNOSIS — J209 Acute bronchitis, unspecified: Secondary | ICD-10-CM

## 2014-08-17 DIAGNOSIS — J45901 Unspecified asthma with (acute) exacerbation: Secondary | ICD-10-CM | POA: Diagnosis not present

## 2014-08-17 DIAGNOSIS — Z7951 Long term (current) use of inhaled steroids: Secondary | ICD-10-CM | POA: Diagnosis not present

## 2014-08-17 DIAGNOSIS — Z792 Long term (current) use of antibiotics: Secondary | ICD-10-CM | POA: Diagnosis not present

## 2014-08-17 DIAGNOSIS — Z79899 Other long term (current) drug therapy: Secondary | ICD-10-CM | POA: Diagnosis not present

## 2014-08-17 DIAGNOSIS — R0602 Shortness of breath: Secondary | ICD-10-CM | POA: Diagnosis present

## 2014-08-17 MED ORDER — ALBUTEROL SULFATE HFA 108 (90 BASE) MCG/ACT IN AERS
2.0000 | INHALATION_SPRAY | Freq: Once | RESPIRATORY_TRACT | Status: AC
  Administered 2014-08-17: 2 via RESPIRATORY_TRACT
  Filled 2014-08-17: qty 6.7

## 2014-08-17 MED ORDER — PREDNISONE 20 MG PO TABS: 40.0000 mg | ORAL_TABLET | Freq: Every day | ORAL | Status: DC

## 2014-08-17 MED ORDER — ACETAMINOPHEN 325 MG PO TABS
650.0000 mg | ORAL_TABLET | Freq: Once | ORAL | Status: AC
  Administered 2014-08-17: 650 mg via ORAL
  Filled 2014-08-17: qty 2

## 2014-08-17 MED ORDER — ALBUTEROL SULFATE (2.5 MG/3ML) 0.083% IN NEBU
2.5000 mg | INHALATION_SOLUTION | Freq: Four times a day (QID) | RESPIRATORY_TRACT | Status: AC | PRN
Start: 2014-08-17 — End: ?

## 2014-08-17 MED ORDER — ALBUTEROL SULFATE (2.5 MG/3ML) 0.083% IN NEBU
5.0000 mg | INHALATION_SOLUTION | Freq: Once | RESPIRATORY_TRACT | Status: AC
  Administered 2014-08-17: 5 mg via RESPIRATORY_TRACT
  Filled 2014-08-17: qty 6

## 2014-08-17 MED ORDER — GUAIFENESIN 100 MG/5ML PO LIQD: 100.0000 mg | ORAL | Status: DC | PRN

## 2014-08-17 NOTE — ED Provider Notes (Signed)
CSN: [id number]     Arrival date & time 08/17/14  0909 History   First MD Initiated Contact with Patient 08/17/14 [phone]     Chief Complaint  Patient presents with  . Shortness of Breath   Maurice Koch is a 29 y.o. male with a history of asthma who presents to the ED complaining of cough, shortness of breath and body aches since yesterday. He also reports chills, runny nose, watery eyes and body aches since yesterday. He did not get his flu shot this year. He reports being out of his albuterol inhaler at home. He reports one episode of post tussive emesis yesterday. He denies nausea or abdominal pain. Patient complains of 8/10 body aches. He reports taking ibuprofen 400 mg at 2 am this am. The patient denies getting his flu shot this year. The patient denies sick contacts, chest pain, palpitations, abdominal pain, urinary symptoms, constipation, diarrhea, or rashes. The patient is not a smoker.  (Consider location/radiation/quality/duration/timing/severity/associated sxs/prior Treatment) HPI  Past Medical History  Diagnosis Date  . Asthma    Past Surgical History  Procedure Laterality Date  . Appendectomy     Family History  Problem Relation Age of Onset  . Diabetes Other   . Asthma Other    History  Substance Use Topics  . Smoking status: Never Smoker   . Smokeless tobacco: Not on file  . Alcohol Use: No    Review of Systems  Constitutional: Positive for fever and chills.  HENT: Positive for rhinorrhea, sneezing and sore throat. Negative for congestion, ear pain and trouble swallowing.   Eyes: Negative for pain and visual disturbance.  Respiratory: Positive for cough, chest tightness, shortness of breath and wheezing.   Cardiovascular: Negative for chest pain, palpitations and leg swelling.  Gastrointestinal: Positive for vomiting. Negative for nausea, abdominal pain and diarrhea.  Genitourinary: Negative for dysuria, hematuria and flank pain.  Musculoskeletal: Positive for  arthralgias. Negative for back pain and neck pain.  Skin: Negative for rash.  Neurological: Negative for syncope, weakness, light-headedness and headaches.      Allergies  Shellfish allergy  Home Medications   Prior to Admission medications   Medication Sig Start Date End Date Taking? Authorizing Provider  albuterol (PROVENTIL HFA;VENTOLIN HFA) 108 (90 BASE) MCG/ACT inhaler Inhale 2 puffs into the lungs every 6 (six) hours as needed for wheezing or shortness of breath.   Yes Historical Provider, MD  ibuprofen (ADVIL,MOTRIN) 200 MG tablet Take 400 mg by mouth every 6 (six) hours as needed for mild pain.   Yes Historical Provider, MD  albuterol (PROVENTIL) (2.5 MG/3ML) 0.083% nebulizer solution Take 3 mLs (2.5 mg total) by nebulization every 6 (six) hours as needed for wheezing or shortness of breath. 08/17/14   Everlene FarrierWilliam Lavinia Mcneely, PA-C  azithromycin (ZITHROMAX) 250 MG tablet Take 1 tablet (250 mg total) by mouth daily. Take first 2 tablets together, then 1 every day until finished. Patient not taking: Reported on 08/17/2014 05/16/13   Junius FinnerErin O'Malley, PA-C  Budesonide (PULMICORT IN) Inhale 1 ampule into the lungs 2 (two) times daily.    Historical Provider, MD  guaiFENesin (ROBITUSSIN) 100 MG/5ML liquid Take 5-10 mLs (100-200 mg total) by mouth every 4 (four) hours as needed for cough. 08/17/14   Everlene FarrierWilliam Melissaann Dizdarevic, PA-C  HYDROcodone-acetaminophen (NORCO/VICODIN) 5-325 MG per tablet 1-2 tabs every 4-6 hours as needed for pain. Patient not taking: Reported on 08/17/2014 05/16/13   Junius FinnerErin O'Malley, PA-C  ipratropium (ATROVENT HFA) 17 MCG/ACT inhaler Inhale 2 puffs into  the lungs every 6 (six) hours as needed for wheezing.    Historical Provider, MD  predniSONE (DELTASONE) 20 MG tablet Take 2 tablets (40 mg total) by mouth daily. 08/17/14   Everlene FarrierWilliam Erla Bacchi, PA-C   BP 118/52 mmHg  Pulse 95  Temp(Src) 98.8 F (37.1 C) (Oral)  Resp 20  Ht 5\' 6"  (1.676 m)  Wt 160 lb (72.576 kg)  BMI 25.84 kg/m2  SpO2  94% Physical Exam  Constitutional: He appears well-developed and well-nourished. No distress.  Nontoxic appearing.  HENT:  Head: Normocephalic and atraumatic.  Right Ear: External ear normal.  Left Ear: External ear normal.  Nose: Nose normal.  Mouth/Throat: Oropharynx is clear and moist. No oropharyngeal exudate.  Bilateral tympanic membranes are pearly-gray without erythema or loss of landmarks.   Eyes: Conjunctivae are normal. Pupils are equal, round, and reactive to light. Right eye exhibits no discharge. Left eye exhibits no discharge.  Neck: Normal range of motion. Neck supple. No JVD present. No tracheal deviation present.  Cardiovascular: Normal rate, regular rhythm, normal heart sounds and intact distal pulses.  Exam reveals no gallop and no friction rub.   No murmur heard. Heart rate is 96.  Pulmonary/Chest: Effort normal. No respiratory distress. He has wheezes. He has no rales. He exhibits no tenderness.  Diminished lung sounds bilaterally with scattered mild  wheezes.  Abdominal: Soft. Bowel sounds are normal. He exhibits no distension. There is no tenderness.  Abdomen is soft and nontender to palpation.  Musculoskeletal: He exhibits no edema or tenderness.  No lower extremity edema or tenderness.  Lymphadenopathy:    He has no cervical adenopathy.  Neurological: He is alert. Coordination normal.  Skin: Skin is warm and dry. No rash noted. He is not diaphoretic. No erythema. No pallor.  Psychiatric: He has a normal mood and affect. His behavior is normal.  Nursing note and vitals reviewed.   ED Course  Procedures (including critical care time) Labs Review Labs Reviewed - No data to display  Imaging Review Dg Chest 2 View  08/17/2014   CLINICAL DATA:  29 year old male with shortness of breath. History of asthma. Initial encounter.  EXAM: CHEST  2 VIEW  COMPARISON:  05/16/2013.  FINDINGS: The heart size and mediastinal contours are within normal limits. Both lungs are  clear. The visualized skeletal structures are unremarkable.  IMPRESSION: No active cardiopulmonary disease.   Electronically Signed   By: Lacy DuverneySteven  Olson M.D.   On: 08/17/2014 11:40     EKG Interpretation None      Filed Vitals:   08/17/14 1100 08/17/14 1103 08/17/14 1119 08/17/14 1200  BP: 116/67  115/62 118/52  Pulse: 120  96 95  Temp:    98.8 F (37.1 C)  TempSrc:    Oral  Resp:  22  20  Height:      Weight:      SpO2: 98%  94% 94%     MDM   Meds given in ED:  Medications  albuterol (PROVENTIL HFA;VENTOLIN HFA) 108 (90 BASE) MCG/ACT inhaler 2 puff (not administered)  albuterol (PROVENTIL) (2.5 MG/3ML) 0.083% nebulizer solution 5 mg (5 mg Nebulization Given 08/17/14 1023)  acetaminophen (TYLENOL) tablet 650 mg (650 mg Oral Given 08/17/14 1023)    New Prescriptions   GUAIFENESIN (ROBITUSSIN) 100 MG/5ML LIQUID    Take 5-10 mLs (100-200 mg total) by mouth every 4 (four) hours as needed for cough.   PREDNISONE (DELTASONE) 20 MG TABLET    Take 2 tablets (40 mg total)  by mouth daily.    Final diagnoses:  Acute bronchitis, unspecified organism   This is a 29 year old male with a history of asthma who presents to the emergency department complaining of cough, wheezing, shortness of breath and body aches since yesterday. On exam the patient is afebrile and nontoxic appearing. He has diminished lung sounds bilaterally with scattered wheezes. The patient is not a smoker. Patient given 5 mg albuterol treatment, Tylenol and chest x-ray obtained. After breathing treatment the patient reports great improvement with his breathing. He reports feeling much better and ready for discharge. He reports a Tylenol helped with his body aches. The patient heart rate is 88 and his oxygen saturation is 98% on room air. Will discharge the patient with prednisone, Robitussin, and refill of his albuterol nebulizer. Patient is going home with albuterol inhaler. I advised the patient to follow-up with their  primary care provider this week. I advised the patient to return to the emergency department with new or worsening symptoms or new concerns. The patient verbalized understanding and agreement with plan.       Everlene Farrier, PA-C 08/17/14 1204  Geoffery Lyons, MD 08/17/14 1440

## 2014-08-17 NOTE — ED Notes (Signed)
Pt arrives from home via POV. Pt has c/o SOB without relief from inhaler. Pt states sx started yesterday and he has been having body aches as well.

## 2014-08-17 NOTE — Discharge Instructions (Signed)

## 2015-01-14 ENCOUNTER — Encounter (HOSPITAL_COMMUNITY): Payer: Self-pay

## 2015-01-14 ENCOUNTER — Emergency Department (HOSPITAL_COMMUNITY)
Admission: EM | Admit: 2015-01-14 | Discharge: 2015-01-14 | Disposition: A | Discharge status: Home / Self Care | Payer: BLUE CROSS/BLUE SHIELD | Attending: Emergency Medicine | Admitting: Emergency Medicine

## 2015-01-14 ENCOUNTER — Emergency Department (HOSPITAL_COMMUNITY): Payer: BLUE CROSS/BLUE SHIELD

## 2015-01-14 DIAGNOSIS — R1084 Generalized abdominal pain: Secondary | ICD-10-CM | POA: Diagnosis not present

## 2015-01-14 DIAGNOSIS — J45901 Unspecified asthma with (acute) exacerbation: Secondary | ICD-10-CM | POA: Insufficient documentation

## 2015-01-14 DIAGNOSIS — R112 Nausea with vomiting, unspecified: Secondary | ICD-10-CM | POA: Diagnosis present

## 2015-01-14 DIAGNOSIS — Z9049 Acquired absence of other specified parts of digestive tract: Secondary | ICD-10-CM | POA: Insufficient documentation

## 2015-01-14 DIAGNOSIS — Z79899 Other long term (current) drug therapy: Secondary | ICD-10-CM | POA: Insufficient documentation

## 2015-01-14 LAB — COMPREHENSIVE METABOLIC PANEL
ALT: 21 U/L (ref 17–63)
AST: 18 U/L (ref 15–41)
Albumin: 4.2 g/dL (ref 3.5–5.0)
Alkaline Phosphatase: 66 U/L (ref 38–126)
Anion gap: 10 (ref 5–15)
BILIRUBIN TOTAL: 0.6 mg/dL (ref 0.3–1.2)
BUN: 15 mg/dL (ref 6–20)
CALCIUM: 9.5 mg/dL (ref 8.9–10.3)
CHLORIDE: 105 mmol/L (ref 101–111)
CO2: 25 mmol/L (ref 22–32)
CREATININE: 0.88 mg/dL (ref 0.61–1.24)
GFR calc Af Amer: >60 mL/min (ref >60)
Glucose, Bld: 86 mg/dL (ref 65–99)
Potassium: 3.8 mmol/L (ref 3.5–5.1)
Sodium: 140 mmol/L (ref 135–145)
TOTAL PROTEIN: 7.7 g/dL (ref 6.5–8.1)

## 2015-01-14 LAB — CBC
HCT: 47.1 % (ref 39.0–52.0)
Hemoglobin: 15.4 g/dL (ref 13.0–17.0)
MCH: 26.6 pg (ref 26.0–34.0)
MCHC: 32.7 g/dL (ref 30.0–36.0)
MCV: 81.3 fL (ref 78.0–100.0)
Platelets: 232 10*3/uL (ref 150–400)
RBC: 5.79 MIL/uL (ref 4.22–5.81)
RDW: 13.6 % (ref 11.5–15.5)
WBC: 12.8 10*3/uL — ABNORMAL HIGH (ref 4.0–10.5)

## 2015-01-14 LAB — URINE MICROSCOPIC-ADD ON

## 2015-01-14 LAB — URINALYSIS, ROUTINE W REFLEX MICROSCOPIC
BILIRUBIN URINE: NEGATIVE
Glucose, UA: NEGATIVE mg/dL
Hgb urine dipstick: NEGATIVE
KETONES UR: 15 mg/dL — AB
LEUKOCYTES UA: NEGATIVE
Nitrite: NEGATIVE
PH: 8.5 — AB (ref 5.0–8.0)
PROTEIN: 30 mg/dL — AB
Specific Gravity, Urine: 1.03 (ref 1.005–1.030)
UROBILINOGEN UA: 1 mg/dL (ref 0.0–1.0)

## 2015-01-14 LAB — LIPASE, BLOOD: Lipase: 25 U/L (ref 22–51)

## 2015-01-14 MED ORDER — MORPHINE SULFATE (PF) 4 MG/ML IV SOLN
4.0000 mg | Freq: Once | INTRAVENOUS | Status: AC
  Administered 2015-01-14: 4 mg via INTRAVENOUS
  Filled 2015-01-14: qty 1

## 2015-01-14 MED ORDER — SODIUM CHLORIDE 0.9 % IV BOLUS (SEPSIS)
1000.0000 mL | Freq: Once | INTRAVENOUS | Status: AC
  Administered 2015-01-14: 1000 mL via INTRAVENOUS

## 2015-01-14 MED ORDER — ONDANSETRON 4 MG PO TBDP
4.0000 mg | ORAL_TABLET | Freq: Once | ORAL | Status: AC | PRN
  Administered 2015-01-14: 4 mg via ORAL

## 2015-01-14 MED ORDER — ONDANSETRON 4 MG PO TBDP
ORAL_TABLET | ORAL | Status: AC
  Filled 2015-01-14: qty 1

## 2015-01-14 MED ORDER — ONDANSETRON HCL 4 MG PO TABS: 4.0000 mg | ORAL_TABLET | Freq: Four times a day (QID) | ORAL | Status: DC

## 2015-01-14 MED ORDER — ONDANSETRON HCL 4 MG/2ML IJ SOLN
4.0000 mg | Freq: Once | INTRAMUSCULAR | Status: AC
  Administered 2015-01-14: 4 mg via INTRAVENOUS
  Filled 2015-01-14: qty 2

## 2015-01-14 MED ORDER — HYDROCODONE-ACETAMINOPHEN 5-325 MG PO TABS: 1.0000 | ORAL_TABLET | Freq: Four times a day (QID) | ORAL | Status: DC | PRN

## 2015-01-14 MED ORDER — IOHEXOL 300 MG/ML  SOLN
100.0000 mL | Freq: Once | INTRAMUSCULAR | Status: AC | PRN
  Administered 2015-01-14: 100 mL via INTRAVENOUS

## 2015-01-14 MED ORDER — ONDANSETRON HCL 4 MG/2ML IJ SOLN: 4.0000 mg | Freq: Once | INTRAMUSCULAR | Status: DC

## 2015-01-14 MED ORDER — HYDROMORPHONE HCL 1 MG/ML IJ SOLN
1.0000 mg | Freq: Once | INTRAMUSCULAR | Status: AC
  Administered 2015-01-14: 1 mg via INTRAVENOUS
  Filled 2015-01-14: qty 1

## 2015-01-14 NOTE — ED Notes (Signed)
Pt presents with onset of nausea and vomiting, umbilical pain that began early this morning.  Pt reports inability to keep water down, reports also increasing shortness of breath despite using inhaler and neb treatment.  Daughter had same x 2 days ago.

## 2015-01-14 NOTE — ED Provider Notes (Signed)
CSN: [id number]     Arrival date & time 01/14/15  1127 History   First MD Initiated Contact with Patient 01/14/15 1204     Chief Complaint  Patient presents with  . Emesis     (Consider location/radiation/quality/duration/timing/severity/associated sxs/prior Treatment) HPI 29 year old male with history of appendectomy who presents with nausea vomiting and abdominal pain. Reports that his daughter was sick with nausea vomiting earlier this week, and yesterday began to developed multiple episodes of nonbilious and nonbloody emesis. He has had abdominal distention with intermittent cramping. Had a normal bowel movement yesterday, but does not believe that he is passing gas or having a bowel movement today. Denies any diarrhea, fever, chills, serious, urinary frequency, or hematuria.    Past Medical History  Diagnosis Date  . Asthma    Past Surgical History  Procedure Laterality Date  . Appendectomy     Family History  Problem Relation Age of Onset  . Diabetes Other   . Asthma Other    Social History  Substance Use Topics  . Smoking status: Never Smoker   . Smokeless tobacco: None  . Alcohol Use: No    Review of Systems 10/14 systems reviewed and are negative other than those stated in the HPI   Allergies  Shellfish allergy  Home Medications   Prior to Admission medications   Medication Sig Start Date End Date Taking? Authorizing Provider  albuterol (PROVENTIL HFA;VENTOLIN HFA) 108 (90 BASE) MCG/ACT inhaler Inhale 2 puffs into the lungs every 6 (six) hours as needed for wheezing or shortness of breath.   Yes Historical Provider, MD  albuterol (PROVENTIL) (2.5 MG/3ML) 0.083% nebulizer solution Take 3 mLs (2.5 mg total) by nebulization every 6 (six) hours as needed for wheezing or shortness of breath. 08/17/14  Yes Everlene Farrier, PA-C  azithromycin (ZITHROMAX) 250 MG tablet Take 1 tablet (250 mg total) by mouth daily. Take first 2 tablets together, then 1 every day until  finished. Patient not taking: Reported on 08/17/2014 05/16/13   Junius Finner, PA-C  guaiFENesin (ROBITUSSIN) 100 MG/5ML liquid Take 5-10 mLs (100-200 mg total) by mouth every 4 (four) hours as needed for cough. Patient not taking: Reported on 01/14/2015 08/17/14   Everlene Farrier, PA-C  HYDROcodone-acetaminophen (NORCO/VICODIN) 5-325 MG per tablet 1-2 tabs every 4-6 hours as needed for pain. Patient not taking: Reported on 08/17/2014 05/16/13   Junius Finner, PA-C  HYDROcodone-acetaminophen (NORCO/VICODIN) 5-325 MG tablet Take 1 tablet by mouth every 6 (six) hours as needed for moderate pain or severe pain. 01/14/15   Lavera Guise, MD  ondansetron (ZOFRAN) 4 MG tablet Take 1 tablet (4 mg total) by mouth every 6 (six) hours. 01/14/15   Lavera Guise, MD  predniSONE (DELTASONE) 20 MG tablet Take 2 tablets (40 mg total) by mouth daily. Patient not taking: Reported on 01/14/2015 08/17/14   Everlene Farrier, PA-C   BP 106/53 mmHg  Pulse 88  Temp(Src) 98.9 F (37.2 C) (Oral)  Resp 16  SpO2 98% Physical Exam Physical Exam  Nursing note and vitals reviewed. Constitutional: Well developed, well nourished, non-toxic, and in no acute distress Head: Normocephalic and atraumatic.  Mouth/Throat: Oropharynx is clear and moist.  Neck: Normal range of motion. Neck supple.  Cardiovascular: Normal rate and regular rhythm.   Pulmonary/Chest: Effort normal and breath sounds normal.  Abdominal: Soft. Moderately distended. Diffuse tenderness to palpation. There is no rebound and no guarding.  Musculoskeletal: Normal range of motion.  Neurological: Alert, no facial droop, fluent speech, moves all  extremities symmetrically Skin: Skin is warm and dry.  Psychiatric: Cooperative  ED Course  Procedures (including critical care time) Labs Review Labs Reviewed  CBC - Abnormal; Notable for the following:    WBC 12.8 (*)    All other components within normal limits  URINALYSIS, ROUTINE W REFLEX MICROSCOPIC (NOT AT [facility]) -  Abnormal; Notable for the following:    pH 8.5 (*)    Ketones, ur 15 (*)    Protein, ur 30 (*)    All other components within normal limits  URINE MICROSCOPIC-ADD ON - Abnormal; Notable for the following:    Bacteria, UA FEW (*)    All other components within normal limits  LIPASE, BLOOD  COMPREHENSIVE METABOLIC PANEL    Imaging Review Ct Abdomen Pelvis W Contrast  01/14/2015   CLINICAL DATA:  Nausea, vomiting, umbilical pain since this morning.  EXAM: CT ABDOMEN AND PELVIS WITH CONTRAST  TECHNIQUE: Multidetector CT imaging of the abdomen and pelvis was performed using the standard protocol following bolus administration of intravenous contrast.  CONTRAST:  OMNIPAQUE IOHEXOL 300 MG/ML  SOLN  COMPARISON:  None.  FINDINGS: Tiny hypodensities in the liver are likely benign.  Gallbladder, spleen, pancreas, adrenal glands are within normal limits  Simple cysts in the kidneys.  Post appendectomy.  Bladder and prostate are within normal limits  No free-fluid.  No abnormal retroperitoneal adenopathy.  L5 is sacralized.  No vertebral compression deformity.  IMPRESSION: Small hypodensities in the liver are likely benign. If the patient has a history of malignancy or is at high risk factors for malignancy, consider six-month follow-up liver MRI.  No acute intra-abdominal pathology.   Electronically Signed   By: Jolaine Click M.D.   On: 01/14/2015 16:52   Dg Abd Acute W/chest  01/14/2015   CLINICAL DATA:  Generalized abdominal pain, nausea, vomiting.  EXAM: DG ABDOMEN ACUTE W/ 1V CHEST  COMPARISON:  Aug 17, 2014.  FINDINGS: There is no evidence of dilated bowel loops or free intraperitoneal air. Phleboliths are noted in the pelvis. Heart size and mediastinal contours are within normal limits. Both lungs are clear.  IMPRESSION: No evidence of bowel obstruction or ileus. No acute cardiopulmonary disease.   Electronically Signed   By: Lupita Raider, M.D.   On: 01/14/2015 15:04   I have personally reviewed  and evaluated these images and lab results as part of my medical decision-making.   MDM   Final diagnoses:  Non-intractable vomiting with nausea, vomiting of unspecified type  Generalized abdominal pain    29 year old male with history of appendectomy who presents with nausea vomiting and abdominal pain. He is well-appearing and in no acute distress. Vital signs are within normal limits. On exam he has a moderately distended abdomen that is diffusely tender to palpation. Pain worsened intermittently he says. Daughter had similar illness earlier this last week, but he also is at risk for bowel obstruction and given his distention, intermittent pain, and lack of passing gas will perform CT abd/pelvis. His pain also seems more out of proportion to what is normally expected from benign GI illness. CT did not show any acute intra-abdominal process, not consistent with obstruction either. I given IV fluids, antibiotics, analgesics. Feel symptomatically improved and is able to tolerate oral fluids and crackers. Strict return and follow-up instructions reviewed. He expressed understanding of all discharge instructions and felt comfortable with the plan of care.     Lavera Guise, MD 01/14/15 2049

## 2015-01-14 NOTE — Discharge Instructions (Signed)
Return without fail for worsening symptoms, including worsening pain, vomiting despite nausea medications and unable to keep down food/fluids, or any other symptoms concerning to you.  Nausea and Vomiting Nausea means you feel sick to your stomach. Throwing up (vomiting) is a reflex where stomach contents come out of your mouth. HOME CARE   Take medicine as told by your doctor.  Do not force yourself to eat. However, you do need to drink fluids.  If you feel like eating, eat a normal diet as told by your doctor.  Eat rice, wheat, potatoes, bread, lean meats, yogurt, fruits, and vegetables.  Avoid high-fat foods.  Drink enough fluids to keep your pee (urine) clear or pale yellow.  Ask your doctor how to replace body fluid losses (rehydrate). Signs of body fluid loss (dehydration) include:  Feeling very thirsty.  Dry lips and mouth.  Feeling dizzy.  Dark pee.  Peeing less than normal.  Feeling confused.  Fast breathing or heart rate. GET HELP RIGHT AWAY IF:   You have blood in your throw up.  You have black or bloody poop (stool).  You have a bad headache or stiff neck.  You feel confused.  You have bad belly (abdominal) pain.  You have chest pain or trouble breathing.  You do not pee at least once every 8 hours.  You have cold, clammy skin.  You keep throwing up after 24 to 48 hours.  You have a fever. MAKE SURE YOU:   Understand these instructions.  Will watch your condition.  Will get help right away if you are not doing well or get worse.   This information is not intended to replace advice given to you by your health care provider. Make sure you discuss any questions you have with your health care provider.   Document Released: 09/12/2007 Document Revised: 06/18/2011 Document Reviewed: 08/25/2010 Elsevier Interactive Patient Education [employer]

## 2015-02-07 ENCOUNTER — Emergency Department (HOSPITAL_COMMUNITY)
Admission: EM | Admit: 2015-02-07 | Discharge: 2015-02-07 | Disposition: A | Discharge status: Home / Self Care | Payer: BLUE CROSS/BLUE SHIELD | Attending: Emergency Medicine | Admitting: Emergency Medicine

## 2015-02-07 ENCOUNTER — Emergency Department (HOSPITAL_COMMUNITY): Payer: BLUE CROSS/BLUE SHIELD

## 2015-02-07 ENCOUNTER — Encounter (HOSPITAL_COMMUNITY): Payer: Self-pay | Admitting: *Deleted

## 2015-02-07 DIAGNOSIS — Z79899 Other long term (current) drug therapy: Secondary | ICD-10-CM | POA: Insufficient documentation

## 2015-02-07 DIAGNOSIS — K5900 Constipation, unspecified: Secondary | ICD-10-CM | POA: Diagnosis not present

## 2015-02-07 DIAGNOSIS — R197 Diarrhea, unspecified: Secondary | ICD-10-CM | POA: Diagnosis not present

## 2015-02-07 DIAGNOSIS — J45909 Unspecified asthma, uncomplicated: Secondary | ICD-10-CM | POA: Diagnosis not present

## 2015-02-07 DIAGNOSIS — R109 Unspecified abdominal pain: Secondary | ICD-10-CM | POA: Diagnosis present

## 2015-02-07 DIAGNOSIS — R110 Nausea: Secondary | ICD-10-CM | POA: Diagnosis not present

## 2015-02-07 DIAGNOSIS — Z8719 Personal history of other diseases of the digestive system: Secondary | ICD-10-CM

## 2015-02-07 LAB — URINALYSIS, ROUTINE W REFLEX MICROSCOPIC
Bilirubin Urine: NEGATIVE
GLUCOSE, UA: 100 mg/dL — AB
HGB URINE DIPSTICK: NEGATIVE
Ketones, ur: 15 mg/dL — AB
Leukocytes, UA: NEGATIVE
Nitrite: NEGATIVE
PROTEIN: NEGATIVE mg/dL
SPECIFIC GRAVITY, URINE: 1.026 (ref 1.005–1.030)
Urobilinogen, UA: 1 mg/dL (ref 0.0–1.0)
pH: 7.5 (ref 5.0–8.0)

## 2015-02-07 LAB — COMPREHENSIVE METABOLIC PANEL
ALBUMIN: 4.2 g/dL (ref 3.5–5.0)
ALK PHOS: 66 U/L (ref 38–126)
ALT: 21 U/L (ref 17–63)
ANION GAP: 9 (ref 5–15)
AST: 19 U/L (ref 15–41)
BUN: 12 mg/dL (ref 6–20)
CALCIUM: 9.2 mg/dL (ref 8.9–10.3)
CO2: 26 mmol/L (ref 22–32)
Chloride: 106 mmol/L (ref 101–111)
Creatinine, Ser: 0.84 mg/dL (ref 0.61–1.24)
GFR calc Af Amer: >60 mL/min (ref >60)
GFR calc non Af Amer: >60 mL/min (ref >60)
GLUCOSE: 121 mg/dL — AB (ref 65–99)
Potassium: 3.9 mmol/L (ref 3.5–5.1)
SODIUM: 141 mmol/L (ref 135–145)
Total Bilirubin: 0.5 mg/dL (ref 0.3–1.2)
Total Protein: 7.3 g/dL (ref 6.5–8.1)

## 2015-02-07 LAB — CBC
HCT: 46.5 % (ref 39.0–52.0)
HEMOGLOBIN: 15 g/dL (ref 13.0–17.0)
MCH: 26.4 pg (ref 26.0–34.0)
MCHC: 32.3 g/dL (ref 30.0–36.0)
MCV: 81.7 fL (ref 78.0–100.0)
Platelets: 243 10*3/uL (ref 150–400)
RBC: 5.69 MIL/uL (ref 4.22–5.81)
RDW: 14 % (ref 11.5–15.5)
WBC: 11.8 10*3/uL — ABNORMAL HIGH (ref 4.0–10.5)

## 2015-02-07 LAB — LIPASE, BLOOD: Lipase: 21 U/L (ref 11–51)

## 2015-02-07 LAB — CK: Total CK: 99 U/L (ref 49–397)

## 2015-02-07 MED ORDER — ONDANSETRON HCL 4 MG/2ML IJ SOLN
4.0000 mg | Freq: Once | INTRAMUSCULAR | Status: AC
  Administered 2015-02-07: 4 mg via INTRAVENOUS
  Filled 2015-02-07: qty 2

## 2015-02-07 MED ORDER — POLYETHYLENE GLYCOL 3350 17 GM/SCOOP PO POWD: 17.0000 g | Freq: Every day | ORAL | Status: DC | PRN

## 2015-02-07 MED ORDER — DICYCLOMINE HCL 20 MG PO TABS: 20.0000 mg | ORAL_TABLET | Freq: Two times a day (BID) | ORAL | Status: AC | PRN

## 2015-02-07 MED ORDER — SODIUM CHLORIDE 0.9 % IV BOLUS (SEPSIS)
1000.0000 mL | Freq: Once | INTRAVENOUS | Status: AC
  Administered 2015-02-07: 1000 mL via INTRAVENOUS

## 2015-02-07 MED ORDER — GI COCKTAIL ~~LOC~~
30.0000 mL | Freq: Once | ORAL | Status: AC
  Administered 2015-02-07: 30 mL via ORAL
  Filled 2015-02-07: qty 30

## 2015-02-07 MED ORDER — DIAZEPAM 5 MG/ML IJ SOLN
2.5000 mg | Freq: Once | INTRAMUSCULAR | Status: AC
  Administered 2015-02-07: 2.5 mg via INTRAVENOUS
  Filled 2015-02-07: qty 2

## 2015-02-07 MED ORDER — DICYCLOMINE HCL 10 MG PO CAPS
10.0000 mg | ORAL_CAPSULE | Freq: Once | ORAL | Status: AC
  Administered 2015-02-07: 10 mg via ORAL
  Filled 2015-02-07: qty 1

## 2015-02-07 MED ORDER — MORPHINE SULFATE (PF) 4 MG/ML IV SOLN
4.0000 mg | Freq: Once | INTRAVENOUS | Status: AC
  Administered 2015-02-07: 4 mg via INTRAVENOUS
  Filled 2015-02-07: qty 1

## 2015-02-07 MED ORDER — FAMOTIDINE IN NACL 20-0.9 MG/50ML-% IV SOLN
20.0000 mg | Freq: Once | INTRAVENOUS | Status: AC
  Administered 2015-02-07: 20 mg via INTRAVENOUS
  Filled 2015-02-07: qty 50

## 2015-02-07 MED ORDER — SODIUM CHLORIDE 0.9 % IV BOLUS (SEPSIS)
1000.0000 mL | Freq: Once | INTRAVENOUS | Status: AC
Start: 2015-02-07 — End: 2015-02-07
  Administered 2015-02-07: 1000 mL via INTRAVENOUS

## 2015-02-07 NOTE — ED Provider Notes (Signed)
Patient presented to the ER with abdominal pain. Patient reports onset of abdominal pain, nausea and vomiting last night. Since then he has developed diarrhea as well. Patient reporting diffuse lower abdominal pain and inability to eat or drink.  Face to face Exam: HEENT - PERRLA Lungs - CTAB Heart - RRR, no M/R/G Abd - mildly distended, diffusely tender, no guarding or peritonitis Neuro - alert, oriented x3  Plan: Lab work, symptomatic treatment  Gilda Creasehristopher J Pollina, MD 02/07/15 [phone]

## 2015-02-07 NOTE — ED Notes (Addendum)
Pt presents via POV c/o abdominal pain, nausea and vomitin, and diarrhea beginning last night at 2300.  Took pepto bismol and tylenol and was unable to keep it down.  Reports daughter had virus a few weeks ago.  Pt a x 4, NAD.

## 2015-02-07 NOTE — Discharge Instructions (Signed)

## 2015-02-07 NOTE — ED Provider Notes (Signed)
CSN: [id number]     Arrival date & time 02/07/15  4098 History   First MD Initiated Contact with Patient 02/07/15 [phone]     Chief Complaint  Patient presents with  . Abdominal Pain     (Consider location/radiation/quality/duration/timing/severity/associated sxs/prior Treatment) Patient is a 29 y.o. male presenting with cramps. The history is provided by the patient.  Abdominal Cramping This is a recurrent problem. The current episode started 1 to 4 weeks ago. The problem occurs intermittently. The problem has been unchanged. Associated symptoms include abdominal pain, nausea and vomiting. Pertinent negatives include no change in bowel habit, chest pain, fever, headaches or rash. Nothing aggravates the symptoms. He has tried nothing for the symptoms.    Past Medical History  Diagnosis Date  . Asthma    Past Surgical History  Procedure Laterality Date  . Appendectomy     Family History  Problem Relation Age of Onset  . Diabetes Other   . Asthma Other    Social History  Substance Use Topics  . Smoking status: Never Smoker   . Smokeless tobacco: None  . Alcohol Use: No    Review of Systems  Constitutional: Negative for fever.  HENT: Negative for facial swelling.   Respiratory: Negative for shortness of breath.   Cardiovascular: Negative for chest pain.  Gastrointestinal: Positive for nausea, vomiting, abdominal pain, diarrhea, constipation and abdominal distention. Negative for blood in stool, anal bleeding, rectal pain and change in bowel habit.  Genitourinary: Negative for dysuria.  Musculoskeletal: Negative for back pain.  Skin: Negative for rash.  Neurological: Negative for headaches.  Psychiatric/Behavioral: Negative for confusion.      Allergies  Shellfish allergy  Home Medications   Prior to Admission medications   Medication Sig Start Date End Date Taking? Authorizing Provider  albuterol (PROVENTIL HFA;VENTOLIN HFA) 108 (90 BASE) MCG/ACT inhaler Inhale 2  puffs into the lungs every 6 (six) hours as needed for wheezing or shortness of breath.   Yes Historical Provider, MD  albuterol (PROVENTIL) (2.5 MG/3ML) 0.083% nebulizer solution Take 3 mLs (2.5 mg total) by nebulization every 6 (six) hours as needed for wheezing or shortness of breath. 08/17/14  Yes Everlene Farrier, PA-C  dicyclomine (BENTYL) 20 MG tablet Take 1 tablet (20 mg total) by mouth 2 (two) times daily as needed for spasms (only if not experiencing constipation). 02/07/15   Gavin Pound, MD  polyethylene glycol powder (MIRALAX) powder Take 17 g by mouth daily as needed for mild constipation or moderate constipation. 02/07/15   Gavin Pound, MD   BP 148/83 mmHg  Pulse 93  Temp(Src) 98.2 F (36.8 C) (Oral)  Resp 20  SpO2 100% Physical Exam  Constitutional: He is oriented to person, place, and time. He appears well-developed and well-nourished. No distress.  HENT:  Head: Normocephalic and atraumatic.  Right Ear: External ear normal.  Left Ear: External ear normal.  Nose: Nose normal.  Mouth/Throat: Oropharynx is clear and moist. No oropharyngeal exudate.  Eyes: Conjunctivae and EOM are normal. Pupils are equal, round, and reactive to light. Right eye exhibits no discharge. Left eye exhibits no discharge. No scleral icterus.  Neck: Normal range of motion. Neck supple. No JVD present. No tracheal deviation present. No thyromegaly present.  Cardiovascular: Normal rate, regular rhythm and intact distal pulses.   Pulmonary/Chest: Effort normal. No stridor. No respiratory distress. He has no wheezes. He has no rales. He exhibits no tenderness.  Abdominal: Soft. He exhibits distension. There is tenderness.  Mild general TTP.  Musculoskeletal: Normal range of motion. He exhibits no edema or tenderness.  Lymphadenopathy:    He has no cervical adenopathy.  Neurological: He is alert and oriented to person, place, and time.  Skin: Skin is warm and dry. No rash noted. He is not diaphoretic.  No erythema. No pallor.  Psychiatric: He has a normal mood and affect. His behavior is normal. Judgment and thought content normal.  Nursing note and vitals reviewed.   ED Course  Procedures (including critical care time) Labs Review Labs Reviewed  COMPREHENSIVE METABOLIC PANEL - Abnormal; Notable for the following:    Glucose, Bld 121 (*)    All other components within normal limits  CBC - Abnormal; Notable for the following:    WBC 11.8 (*)    All other components within normal limits  URINALYSIS, ROUTINE W REFLEX MICROSCOPIC (NOT AT [facility]) - Abnormal; Notable for the following:    Glucose, UA 100 (*)    Ketones, ur 15 (*)    All other components within normal limits  LIPASE, BLOOD  CK    Imaging Review Dg Abd Acute W/chest  02/07/2015  CLINICAL DATA:  Abdominal pain, nausea and vomiting beginning last night. Subsequent development of diarrhea. EXAM: DG ABDOMEN ACUTE W/ 1V CHEST COMPARISON:  CT 01/14/2015 FINDINGS: Bowel gas pattern is normal. No evidence of ileus, obstruction or free air. No significant bone finding. No significant calcifications. Phleboliths noted in the pelvis. One-view chest shows normal heart and mediastinal shadows. The lungs are clear. No free air under the diaphragm. IMPRESSION: Negative abdominal radiographs.  No acute cardiopulmonary disease. Electronically Signed   By: Paulina Fusi M.D.   On: 02/07/2015 12:10   I have personally reviewed and evaluated these images and lab results as part of my medical decision-making.   EKG Interpretation None      MDM   Final diagnoses:  Abdominal cramping  H/O constipation     Patient presented for abdominal pain nausea, and vomiting. Patient states he's not felt well for about the last month. Initially this started with possibly a episode of a viral illness as he had a child was sick around the same time. Patient has a history of prior appendectomy. He did have imaging about 3 weeks ago which did not show any  signs of small bowel obstruction. Patient had laboratory workup which did not show any other acute findings as far as organ dysfunction is concerned, however he did have signs of dehydration, with elevated specific gravity in his urine. Patient was given IV fluids and antiemetics pain medicine GI cocktail and antispasmodic medications. His abdominal wall did seem somewhat tense and crampy. 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 Suspect dehydration is a component of his symptoms in addition patient does have a history of constipation. Most recently he's been having diarrhea. KUB obtained shows no obstructive bowel gas pattern but he does have a reasonable stool Electa Sniff in his rectum and minimal gas throughout the rest of the bowel possibly suggestive of increased stool burden. Exam also seems consistent with this. I suspect he may be having some leakage around areas of constipation. I advised patient to attempt course of MiraLAX and Gatorade to see if he notices improvement in his symptoms. He is not frankly obstructed at this time. He did have some improvement with antispasmodics however I would not want him to start these without initially attempting a bowel cleanout, as it may  make his any potential constipation worse. Exam does not appear consistent with a focal infectious process or other acute process in the abdomen such as mesenteric ischemia, colitis, perforation, or other acute pathology. I did give the patient strict return precautions should he experience worsening of his symptoms with treatment modification. Also advised patient to follow up with primary care physician for repeat evaluation and additional recommendations. Otherwise no other acute concerns at this time.  Patient was given return precautions for abdominal pain, n/v/d.  Pt advised on use of medications as applicable.  Advised to return for actely worsening  symptoms, inability to take medications, or other acute concerns.  Advised to follow up with PCP in 1 week.  Patient was in agreement with and expressed understanding of follow plan, plan of care, and return precautions.  All questions answered prior to discharge.  Patient was discharged in stable condition, ambulating without difficulty.  Patient care was discussed with my attending, Dr. Blinda LeatherwoodPollina.     Gavin PoundJustin Decorian Schuenemann, MD 02/08/15 1545  Gilda Creasehristopher J Pollina, MD 02/10/15 0104

## 2015-05-22 ENCOUNTER — Emergency Department (HOSPITAL_COMMUNITY)
Admission: EM | Admit: 2015-05-22 | Discharge: 2015-05-22 | Disposition: A | Discharge status: Home / Self Care | Payer: 59 | Attending: Emergency Medicine | Admitting: Emergency Medicine

## 2015-05-22 ENCOUNTER — Encounter (HOSPITAL_COMMUNITY): Payer: Self-pay | Admitting: Oncology

## 2015-05-22 DIAGNOSIS — K029 Dental caries, unspecified: Secondary | ICD-10-CM

## 2015-05-22 DIAGNOSIS — Z79899 Other long term (current) drug therapy: Secondary | ICD-10-CM | POA: Diagnosis not present

## 2015-05-22 DIAGNOSIS — J45909 Unspecified asthma, uncomplicated: Secondary | ICD-10-CM | POA: Insufficient documentation

## 2015-05-22 DIAGNOSIS — K0889 Other specified disorders of teeth and supporting structures: Secondary | ICD-10-CM | POA: Diagnosis present

## 2015-05-22 MED ORDER — OXYCODONE-ACETAMINOPHEN 5-325 MG PO TABS: 2.0000 | ORAL_TABLET | ORAL | Status: DC | PRN

## 2015-05-22 MED ORDER — OXYCODONE-ACETAMINOPHEN 5-325 MG PO TABS
1.0000 | ORAL_TABLET | Freq: Once | ORAL | Status: AC
  Administered 2015-05-22: 1 via ORAL
  Filled 2015-05-22: qty 1

## 2015-05-22 MED ORDER — PENICILLIN V POTASSIUM 250 MG PO TABS: 500.0000 mg | ORAL_TABLET | Freq: Four times a day (QID) | ORAL | Status: AC

## 2015-05-22 NOTE — ED Provider Notes (Signed)
CSN: [id number]     Arrival date & time 05/22/15  4098 History   First MD Initiated Contact with Patient 05/22/15 0719     Chief Complaint  Patient presents with  . Dental Pain     (Consider location/radiation/quality/duration/timing/severity/associated sxs/prior Treatment) HPI   Maurice Koch is a 30 y.o M who presents to the ED today c/o dental pain. Patient states that his right back lower molar broke off sometime within the last year. Approximately 2 weeks ago the tooth began hurting and has gotten progressively worse over the last 2 weeks. Patient is not experiencing pain in his jaw, right ear and has an associated headache. Patient has tried ibuprofen, Anbesol and BC powder for pain relief with no improvement in his symptoms. Denies facial swelling, fever, trismus, difficulty swallowing  Past Medical History  Diagnosis Date  . Asthma    Past Surgical History  Procedure Laterality Date  . Appendectomy     Family History  Problem Relation Age of Onset  . Diabetes Other   . Asthma Other    Social History  Substance Use Topics  . Smoking status: Never Smoker   . Smokeless tobacco: Never Used  . Alcohol Use: No    Review of Systems  All other systems reviewed and are negative.     Allergies  Shellfish allergy  Home Medications   Prior to Admission medications   Medication Sig Start Date End Date Taking? Authorizing Provider  albuterol (PROVENTIL HFA;VENTOLIN HFA) 108 (90 BASE) MCG/ACT inhaler Inhale 2 puffs into the lungs every 6 (six) hours as needed for wheezing or shortness of breath.    Historical Provider, MD  albuterol (PROVENTIL) (2.5 MG/3ML) 0.083% nebulizer solution Take 3 mLs (2.5 mg total) by nebulization every 6 (six) hours as needed for wheezing or shortness of breath. 08/17/14   Everlene Farrier, PA-C  dicyclomine (BENTYL) 20 MG tablet Take 1 tablet (20 mg total) by mouth 2 (two) times daily as needed for spasms (only if not experiencing constipation).  02/07/15   Gavin Pound, MD  polyethylene glycol powder (MIRALAX) powder Take 17 g by mouth daily as needed for mild constipation or moderate constipation. 02/07/15   Gavin Pound, MD   BP 129/85 mmHg  Pulse 84  Temp(Src) 98.4 F (36.9 C) (Oral)  Resp 15  Ht  (1.676 m)  Wt 81.647 kg  BMI 29.07 kg/m2  SpO2 98% Physical Exam  Constitutional: He is oriented to person, place, and time. He appears well-developed and well-nourished. No distress.  HENT:  Head: Normocephalic and atraumatic.  Mouth/Throat: Uvula is midline, oropharynx is clear and moist and mucous membranes are normal. He does not have dentures. No oral lesions. No trismus in the jaw. Normal dentition. Dental caries present. No dental abscesses, uvula swelling or lacerations. No oropharyngeal exudate, posterior oropharyngeal edema, posterior oropharyngeal erythema or tonsillar abscesses.    Eyes: Conjunctivae are normal. Right eye exhibits no discharge. Left eye exhibits no discharge. No scleral icterus.  Cardiovascular: Normal rate.   Pulmonary/Chest: Effort normal.  Neurological: He is alert and oriented to person, place, and time. Coordination normal.  Skin: Skin is warm and dry. No rash noted. He is not diaphoretic. No erythema. No pallor.  Psychiatric: He has a normal mood and affect. His behavior is normal.  Nursing note and vitals reviewed.   ED Course  Procedures (including critical care time) Labs Review Labs Reviewed - No data to display  Imaging Review No results found. I have personally reviewed  and evaluated these images and lab results as part of my medical decision-making.   EKG Interpretation None      MDM   Final diagnoses:  Pain due to dental caries    Patient with toothache.  No gross abscess.  Exam unconcerning for Ludwig's angina or spread of infection.  Will treat with penicillin and pain medicine.  Urged patient to follow-up with dentist.       Dub Mikes,  PA-C 05/22/15 0454  Richardean Canal, MD 05/22/15 [phone]

## 2015-05-22 NOTE — ED Notes (Signed)
Pt c/o right lower back dental pain that radiates up cheek to ear x 1 week.  Pt has used BC powder, Anbesol and ibuprofen w/o relief.

## 2015-05-22 NOTE — Discharge Instructions (Signed)
Dental Caries Dental caries is tooth decay. This decay can cause a hole in teeth (cavity) that can get bigger and deeper over time. HOME CARE  Brush and floss your teeth. Do this at least two times a day.  Use a fluoride toothpaste.  Use a mouth rinse if told by your dentist or doctor.  Eat less sugary and starchy foods. Drink less sugary drinks.  Avoid snacking often on sugary and starchy foods. Avoid sipping often on sugary drinks.  Keep regular checkups and cleanings with your dentist.  Use fluoride supplements if told by your dentist or doctor.  Allow fluoride to be applied to teeth if told by your dentist or doctor.   This information is not intended to replace advice given to you by your health care provider. Make sure you discuss any questions you have with your health care provider.  [employer] [facility] [employer] is a great source of information about community services available.  Access by dialing 2-1-1 from anywhere in [place], or by website -  PooledIncome.pl.   Other Local Resources (Updated 04/2015)  Dental  Care   Services    Phone Number and Address  Cost  [facility] For children 68 - 54 years of age:   Cleaning  Tooth brushing/flossing instruction  Sealants, fillings, crowns  Extractions  Emergency treatment  [phone] [address] [place], [place] [address] Charges based on family income.  Medicaid and some insurance plans accepted.     [facility] - [facility], fillings, crowns  Extractions  Emergency treatment [phone] [address] [place], [place]  Pregnant women 26 years of age or older with a Medicaid card  [facility] - [place]  Cleaning  Sealants, fillings, crowns  Extractions  Emergency treatment [phone] [address] [place], [place] Pregnant women 34  years of age or older with a Medicaid card  [facility] - [facility] For children 5 - 20 years of age:   Cleaning  Tooth brushing/flossing instruction  Sealants, fillings, crowns  Extractions  Emergency treatment Limited orthodontic services for patients with Medicaid [phone] [address] [place], [place] [address] Medicaid and [facility] Health Choice cover for children up to age 34 and pregnant women.  Parents of children up to age 67 without Medicaid pay a reduced fee at time of service.  [facility] Department of [employer] For children 74 - 3 years of age:   Cleaning  Tooth brushing/flossing instruction  Sealants, fillings, crowns  Extractions  Emergency treatment Limited orthodontic services for patients with Medicaid [id number] [address] [place], [place].  Medicaid and [place] Health Choice cover for children up to age 62 and pregnant women.  Parents of children up to age 36 without Medicaid pay a reduced fee.  [facility]  Sealants, fillings, crowns  Extractions  Hours: Tuesdays and Thursdays, 4:15 - 8 pm [phone] [address] [place], [place] [address] Services free of charge to [facility] residents ages 18-64 who do not have health insurance, Medicare, [place], or [place] benefits and fall within federal poverty guidelines  [employer]    Provides dental care in addition to primary medical care, nutritional counseling, and pharmacy:  Cleaning  Sealants, fillings, crowns  Extractions  [phone] [facility], [address] [place], [place]  [phone] Ph[facility] Real [facility], [address] [place], [place]  [phone] [facility] [place], [place]  [phone] [facility], [address] [place], [place]  [phone] [facility] [address] [place], [place] Accepts [place], [place], most insurance.  Also provides services available to all with fees adjusted based on ability to pay.    [facility]  Cleaning  Tooth brushing/flossing instruction  Sealants, fillings, crowns  Extractions  Emergency treatment Hours: Tuesdays, Thursdays, and Fridays from 8 am to 5 pm by appointment only. [phone] [address] [place], [place] [address] [facility] residents with Medicaid (depending on eligibility) and children with [facility] Health Choice - call for more information.  [facility]  Extractions only  Hours: 2nd and 4th Thursday of each month from 6:30 am - 9 am.   [phone]. [phone] [place], [place] [address] Ages 68 and older only.  Patients are seen on a first come, first served basis.  [employer]  [employer]  Extractions  Orthodontics  Endodontics  Implants/Crowns/Bridges  Complete and partial dentures [phone] [place], [place] Patients must complete an application for services.  There is often a waiting list.    Follow up with a dentist as soon as possible for consultation and re-evaluation. Take antibiotics as prescribed. Return to the emergency department if you experience severe worsening of her symptoms, facial swelling, fever, chills, difficulty swallowing, difficulty opening your mouth.

## 2015-08-31 ENCOUNTER — Ambulatory Visit (HOSPITAL_COMMUNITY)
Admission: RE | Admit: 2015-08-31 | Discharge: 2015-08-31 | Disposition: A | Discharge status: Home / Self Care | Payer: 59 | Attending: Psychiatry | Admitting: Psychiatry
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
## 2017-01-11 ENCOUNTER — Encounter (HOSPITAL_COMMUNITY): Payer: Self-pay | Admitting: Emergency Medicine

## 2017-01-11 ENCOUNTER — Ambulatory Visit (HOSPITAL_COMMUNITY)
Admission: EM | Admit: 2017-01-11 | Discharge: 2017-01-11 | Disposition: A | Discharge status: Home / Self Care | Payer: Medicaid Other | Attending: Emergency Medicine | Admitting: Emergency Medicine

## 2017-01-11 DIAGNOSIS — Z113 Encounter for screening for infections with a predominantly sexual mode of transmission: Secondary | ICD-10-CM

## 2017-01-11 DIAGNOSIS — Z825 Family history of asthma and other chronic lower respiratory diseases: Secondary | ICD-10-CM | POA: Insufficient documentation

## 2017-01-11 DIAGNOSIS — Z79891 Long term (current) use of opiate analgesic: Secondary | ICD-10-CM | POA: Diagnosis not present

## 2017-01-11 DIAGNOSIS — Z833 Family history of diabetes mellitus: Secondary | ICD-10-CM | POA: Insufficient documentation

## 2017-01-11 DIAGNOSIS — J45909 Unspecified asthma, uncomplicated: Secondary | ICD-10-CM | POA: Insufficient documentation

## 2017-01-11 DIAGNOSIS — R102 Pelvic and perineal pain: Secondary | ICD-10-CM | POA: Diagnosis not present

## 2017-01-11 DIAGNOSIS — Z91013 Allergy to seafood: Secondary | ICD-10-CM | POA: Insufficient documentation

## 2017-01-11 DIAGNOSIS — Z79899 Other long term (current) drug therapy: Secondary | ICD-10-CM | POA: Diagnosis not present

## 2017-01-11 DIAGNOSIS — Z9889 Other specified postprocedural states: Secondary | ICD-10-CM | POA: Insufficient documentation

## 2017-01-11 DIAGNOSIS — Z202 Contact with and (suspected) exposure to infections with a predominantly sexual mode of transmission: Secondary | ICD-10-CM | POA: Diagnosis not present

## 2017-01-11 MED ORDER — AZITHROMYCIN 250 MG PO TABS
ORAL_TABLET | ORAL | Status: AC
  Filled 2017-01-11: qty 4

## 2017-01-11 MED ORDER — AZITHROMYCIN 250 MG PO TABS
1000.0000 mg | ORAL_TABLET | Freq: Once | ORAL | Status: AC
  Administered 2017-01-11: 1000 mg via ORAL

## 2017-01-11 NOTE — ED Provider Notes (Signed)
[facility]    CSN: [id number] Arrival date & time: 01/11/17  1639     History   Chief Complaint Chief Complaint  Patient presents with  . Exposure to STD    HPI MUZAMMIL BRUINS is a 31 y.o. male.   As per nurse's notes patient states that his former fianc was recently diagnosed with pelvic pain due to chlamydia infection. This patient is asymptomatic. No complaints. He is here for empiric treatment.      Past Medical History:  Diagnosis Date  . Asthma     There are no active problems to display for this patient.   Past Surgical History:  Procedure Laterality Date  . APPENDECTOMY         Home Medications    Prior to Admission medications   Medication Sig Start Date End Date Taking? Authorizing Provider  albuterol (PROVENTIL HFA;VENTOLIN HFA) 108 (90 BASE) MCG/ACT inhaler Inhale 2 puffs into the lungs every 6 (six) hours as needed for wheezing or shortness of breath.    [provider]  albuterol (PROVENTIL) (2.5 MG/3ML) 0.083% nebulizer solution Take 3 mLs (2.5 mg total) by nebulization every 6 (six) hours as needed for wheezing or shortness of breath. 08/17/14   Everlene Farrier, PA-C  dicyclomine (BENTYL) 20 MG tablet Take 1 tablet (20 mg total) by mouth 2 (two) times daily as needed for spasms (only if not experiencing constipation). 02/07/15   Gavin Pound, MD  oxyCODONE-acetaminophen (PERCOCET/ROXICET) 5-325 MG tablet Take 2 tablets by mouth every 4 (four) hours as needed for severe pain. 05/22/15   Dowless, Lelon Mast Tripp, PA-C  polyethylene glycol powder (MIRALAX) powder Take 17 g by mouth daily as needed for mild constipation or moderate constipation. 02/07/15   Gavin Pound, MD    Family History Family History  Problem Relation Age of Onset  . Diabetes Other   . Asthma Other     Social History Social History  Substance Use Topics  . Smoking status: Never Smoker  . Smokeless tobacco: Never Used  . Alcohol use No      Allergies   Shellfish allergy   Review of Systems Review of Systems  Constitutional: Negative.   Genitourinary: Negative.   All other systems reviewed and are negative.    Physical Exam Triage Vital Signs ED Triage Vitals [01/11/17 1701]  Enc Vitals Group     BP      Pulse Rate 72     Resp 20     Temp 97.9 F (36.6 C)     Temp Source Oral     SpO2 98 %     Weight      Height      Head Circumference      Peak Flow      Pain Score      Pain Loc      Pain Edu?      Excl. in GC?    No data found.   Updated Vital Signs Pulse 72   Temp 97.9 F (36.6 C) (Oral)   Resp 20   SpO2 98%   Visual Acuity Right Eye Distance:   Left Eye Distance:   Bilateral Distance:    Right Eye Near:   Left Eye Near:    Bilateral Near:     Physical Exam  Constitutional: He is oriented to person, place, and time. He appears well-developed and well-nourished. No distress.  Neck: Neck supple.  Cardiovascular: Normal rate.   Pulmonary/Chest: Effort normal. No respiratory  distress.  Musculoskeletal: He exhibits no edema.  Neurological: He is alert and oriented to person, place, and time. He exhibits normal muscle tone.  Skin: Skin is warm and dry.  Psychiatric: He has a normal mood and affect.  Nursing note and vitals reviewed.    UC Treatments / Results  Labs (all labs ordered are listed, but only abnormal results are displayed) Labs Reviewed  URINE CYTOLOGY ANCILLARY ONLY    EKG  EKG Interpretation None       Radiology No results found.  Procedures Procedures (including critical care time)  Medications Ordered in UC Medications  azithromycin (ZITHROMAX) tablet 1,000 mg (1,000 mg Oral Given 01/11/17 1806)     Initial Impression / Assessment and Plan / UC Course  I have reviewed the triage vital signs and the nursing notes.  Pertinent labs & imaging results that were available during my care of the patient were reviewed by me and considered in my  medical decision making (see chart for details).    You have been treated for Chlamydia today. If the urine test comes back positive for other diseases she will be called and likely able to treat over the telephone.    Final Clinical Impressions(s) / UC Diagnoses   Final diagnoses:  Possible exposure to STD    New Prescriptions New Prescriptions   No medications on file     Controlled Substance Prescriptions  Controlled Substance Registry consulted? Not Applicable   Hayden Rasmussen, NP 01/11/17 1807

## 2017-01-11 NOTE — ED Notes (Signed)
Call back number verified and updated in EPIC... Adv pt to not have SI until lab results comeback neg.... Also adv pt lab results will be on MyChart; instructions given .... Pt verb understanding.   

## 2017-01-11 NOTE — ED Triage Notes (Signed)
Pt reports "x-fiancee" called him and told him that she is being treated for Chlamydia.   Pt is asymptomatic  A&O x4... NAD... Ambulatory

## 2017-01-11 NOTE — Discharge Instructions (Signed)
You have been treated for Chlamydia today. If the urine test comes back positive for other diseases she will be called and likely able to treat over the telephone.

## 2017-01-14 LAB — URINE CYTOLOGY ANCILLARY ONLY
CHLAMYDIA, DNA PROBE: POSITIVE — AB
NEISSERIA GONORRHEA: NEGATIVE
TRICH (WINDOWPATH): NEGATIVE

## 2017-01-16 LAB — URINE CYTOLOGY ANCILLARY ONLY: CANDIDA VAGINITIS: NEGATIVE

## 2017-02-06 ENCOUNTER — Emergency Department (HOSPITAL_COMMUNITY)
Admission: EM | Admit: 2017-02-06 | Discharge: 2017-02-06 | Disposition: A | Discharge status: Home / Self Care | Payer: Medicaid Other | Attending: Emergency Medicine | Admitting: Emergency Medicine

## 2017-02-06 ENCOUNTER — Encounter (HOSPITAL_COMMUNITY): Payer: Self-pay | Admitting: Neurology

## 2017-02-06 ENCOUNTER — Emergency Department (HOSPITAL_COMMUNITY): Payer: Medicaid Other

## 2017-02-06 DIAGNOSIS — J069 Acute upper respiratory infection, unspecified: Secondary | ICD-10-CM | POA: Diagnosis not present

## 2017-02-06 DIAGNOSIS — J45901 Unspecified asthma with (acute) exacerbation: Secondary | ICD-10-CM

## 2017-02-06 DIAGNOSIS — R05 Cough: Secondary | ICD-10-CM | POA: Diagnosis present

## 2017-02-06 MED ORDER — BENZONATATE 100 MG PO CAPS: 100.0000 mg | ORAL_CAPSULE | Freq: Three times a day (TID) | ORAL | 0 refills | Status: AC

## 2017-02-06 MED ORDER — ALBUTEROL SULFATE (2.5 MG/3ML) 0.083% IN NEBU
INHALATION_SOLUTION | RESPIRATORY_TRACT | Status: AC
  Filled 2017-02-06: qty 6

## 2017-02-06 MED ORDER — ALBUTEROL SULFATE (2.5 MG/3ML) 0.083% IN NEBU
5.0000 mg | INHALATION_SOLUTION | Freq: Once | RESPIRATORY_TRACT | Status: AC
  Administered 2017-02-06: 5 mg via RESPIRATORY_TRACT

## 2017-02-06 MED ORDER — BENZONATATE 100 MG PO CAPS
100.0000 mg | ORAL_CAPSULE | Freq: Once | ORAL | Status: AC
  Administered 2017-02-06: 100 mg via ORAL
  Filled 2017-02-06: qty 1

## 2017-02-06 MED ORDER — PREDNISONE 50 MG PO TABS: 50.0000 mg | ORAL_TABLET | Freq: Every day | ORAL | 0 refills | Status: DC

## 2017-02-06 MED ORDER — PREDNISONE 20 MG PO TABS
60.0000 mg | ORAL_TABLET | Freq: Once | ORAL | Status: AC
  Administered 2017-02-06: 60 mg via ORAL
  Filled 2017-02-06: qty 3

## 2017-02-06 NOTE — ED Triage Notes (Addendum)
Pt reports cold sx, cough, runny nose, sneezing, congestion, causing his asthma to flare. Has been using albuterol inhaler, nebulizers at home. Is a x 4. Minimal wheezing to lungs. 99% RA. 99.5 temp, reports got his flu shot last week. Pt reports in past has had to have epi pen due to asthma? (denies this being allergy). There is no respiratory distress.

## 2017-02-06 NOTE — ED Provider Notes (Signed)
[facility]   CSN: [id number] Arrival date & time: 02/06/17  1456     History   Chief Complaint Chief Complaint  Patient presents with  . Asthma    HPI Maurice Koch is a 31 y.o. male.  HPI   31 year old male presents today with complaints of upper respiratory infection.  Patient notes a significant past medical history of asthma.  He reports that 2 days ago he developed nasal congestion, runny nose, cough, and chest tightness.  He notes this is typical of upper respiratory infections to cause asthma exacerbations.  He notes he has been using a nebulizer at home, albuterol, Singulair, and Claritin.  He notes very minimal improvement with symptoms at home.  Patient denies any fever or significant chest pain.  Reports a history of intubation as a child due to asthma attack.    Past Medical History:  Diagnosis Date  . Asthma     There are no active problems to display for this patient.   Past Surgical History:  Procedure Laterality Date  . APPENDECTOMY         Home Medications    Prior to Admission medications   Medication Sig Start Date End Date Taking? Authorizing Provider  albuterol (PROVENTIL HFA;VENTOLIN HFA) 108 (90 BASE) MCG/ACT inhaler Inhale 2 puffs into the lungs every 6 (six) hours as needed for wheezing or shortness of breath.    [provider]  albuterol (PROVENTIL) (2.5 MG/3ML) 0.083% nebulizer solution Take 3 mLs (2.5 mg total) by nebulization every 6 (six) hours as needed for wheezing or shortness of breath. 08/17/14   Everlene Farrieransie, William, PA-C  benzonatate (TESSALON) 100 MG capsule Take 1 capsule (100 mg total) by mouth every 8 (eight) hours. 02/06/17   Latiesha Harada, Tinnie GensJeffrey, PA-C  dicyclomine (BENTYL) 20 MG tablet Take 1 tablet (20 mg total) by mouth 2 (two) times daily as needed for spasms (only if not experiencing constipation). 02/07/15   Gavin PoundBrooten, Justin, MD  oxyCODONE-acetaminophen  (PERCOCET/ROXICET) 5-325 MG tablet Take 2 tablets by mouth every 4 (four) hours as needed for severe pain. 05/22/15   Dowless, Lelon MastSamantha Tripp, PA-C  polyethylene glycol powder (MIRALAX) powder Take 17 g by mouth daily as needed for mild constipation or moderate constipation. 02/07/15   Gavin PoundBrooten, Justin, MD  predniSONE (DELTASONE) 50 MG tablet Take 1 tablet (50 mg total) by mouth daily. 02/06/17   Eyvonne MechanicHedges, Chaden Doom, PA-C    Family History Family History  Problem Relation Age of Onset  . Diabetes Other   . Asthma Other     Social History Social History  Substance Use Topics  . Smoking status: Never Smoker  . Smokeless tobacco: Never Used  . Alcohol use No     Allergies   Shellfish allergy   Review of Systems Review of Systems  All other systems reviewed and are negative.    Physical Exam Updated Vital Signs BP 135/74   Pulse (!) 109   Temp 98.9 F (37.2 C) (Oral)   Resp 18   Ht 5\' 6"  (1.676 m)   Wt 82.1 kg (181 lb)   SpO2 98%   BMI 29.21 kg/m   Physical Exam  Constitutional: He is oriented to person, place, and time. He appears well-developed and well-nourished.  HENT:  Head: Normocephalic and atraumatic.  Rhinorrhea  Eyes: Pupils are equal, round, and reactive to light. Conjunctivae are normal. Right eye exhibits no discharge. Left eye exhibits no discharge. No scleral icterus.  Neck: Normal  range of motion. No JVD present. No tracheal deviation present.  Cardiovascular: Regular rhythm, normal heart sounds and intact distal pulses.   Pulmonary/Chest: Effort normal and breath sounds normal. No stridor. No respiratory distress. He has no wheezes. He has no rales. He exhibits no tenderness.  Neurological: He is alert and oriented to person, place, and time. Coordination normal.  Psychiatric: He has a normal mood and affect. His behavior is normal. Judgment and thought content normal.  Nursing note and vitals reviewed.    ED Treatments / Results  Labs (all labs  ordered are listed, but only abnormal results are displayed) Labs Reviewed - No data to display  EKG  EKG Interpretation None       Radiology Dg Chest 2 View  Result Date: 02/06/2017 CLINICAL DATA:  Asthma.  Wheezing. EXAM: CHEST  2 VIEW COMPARISON:  02/07/2015 and 08/17/2014 FINDINGS: The heart size and mediastinal contours are within normal limits. Both lungs are clear. The visualized skeletal structures are unremarkable. IMPRESSION: Normal exam. Electronically Signed   By: Francene Boyers M.D.   On: 02/06/2017 15:42    Procedures Procedures (including critical care time)  Medications Ordered in ED Medications  albuterol (PROVENTIL) (2.5 MG/3ML) 0.083% nebulizer solution (not administered)  albuterol (PROVENTIL) (2.5 MG/3ML) 0.083% nebulizer solution 5 mg (5 mg Nebulization Given 02/06/17 1508)  benzonatate (TESSALON) capsule 100 mg (100 mg Oral Given 02/06/17 1959)  predniSONE (DELTASONE) tablet 60 mg (60 mg Oral Given 02/06/17 1959)     Initial Impression / Assessment and Plan / ED Course  I have reviewed the triage vital signs and the nursing notes.  Pertinent labs & imaging results that were available during my care of the patient were reviewed by me and considered in my medical decision making (see chart for details).      Final Clinical Impressions(s) / ED Diagnoses   Final diagnoses:  Moderate asthma with exacerbation, unspecified whether persistent  Viral upper respiratory tract infection    Labs:   Imaging: DG chest 2 view  Consults:  Therapeutics: DuoNeb, prednisone, Tessalon  Discharge Meds: Prednisone, Tessalon  Assessment/Plan: 31 year old male presents today with likely viral URI with secondary asthma exacerbation.  Patient received breathing treatment prior to my evaluation, he had clear lung sounds, negative plain films, afebrile with reassuring oxygen saturation.  Patient will be given a short course of steroids given his significant past  medical history.  I see no signs of acute bacterial infection.  He will follow-up as an outpatient with his primary care return immediately with any new or worsening signs or symptoms.  Patient verbalized understanding and agreement to today's plan.      New Prescriptions Discharge Medication List as of 02/06/2017  8:18 PM    START taking these medications   Details  benzonatate (TESSALON) 100 MG capsule Take 1 capsule (100 mg total) by mouth every 8 (eight) hours., Starting Wed 02/06/2017, Print    predniSONE (DELTASONE) 50 MG tablet Take 1 tablet (50 mg total) by mouth daily., Starting Wed 02/06/2017, Print         Avilene Marrin, Tinnie Gens, PA-C 02/06/17 2037    Arby Barrette, MD 02/06/17 2258

## 2017-02-06 NOTE — Discharge Instructions (Signed)
Please read attached information. If you experience any new or worsening signs or symptoms please return to the emergency room for evaluation. Please follow-up with your primary care provider or specialist as discussed. Please use medication prescribed only as directed and discontinue taking if you have any concerning signs or symptoms.   °

## 2017-02-08 ENCOUNTER — Other Ambulatory Visit: Payer: Self-pay

## 2017-02-08 ENCOUNTER — Emergency Department (HOSPITAL_COMMUNITY)
Admission: EM | Admit: 2017-02-08 | Discharge: 2017-02-08 | Disposition: A | Discharge status: Home / Self Care | Payer: Medicaid Other | Attending: Emergency Medicine | Admitting: Emergency Medicine

## 2017-02-08 ENCOUNTER — Emergency Department (HOSPITAL_COMMUNITY): Payer: Medicaid Other

## 2017-02-08 ENCOUNTER — Encounter (HOSPITAL_COMMUNITY): Payer: Self-pay | Admitting: Emergency Medicine

## 2017-02-08 DIAGNOSIS — J45901 Unspecified asthma with (acute) exacerbation: Secondary | ICD-10-CM | POA: Diagnosis not present

## 2017-02-08 DIAGNOSIS — R05 Cough: Secondary | ICD-10-CM | POA: Diagnosis present

## 2017-02-08 MED ORDER — METHYLPREDNISOLONE SODIUM SUCC 125 MG IJ SOLR
125.0000 mg | Freq: Once | INTRAMUSCULAR | Status: AC
  Administered 2017-02-08: 125 mg via INTRAMUSCULAR
  Filled 2017-02-08: qty 2

## 2017-02-08 MED ORDER — CIPROFLOXACIN-HYDROCORTISONE 0.2-1 % OT SUSP: 3.0000 [drp] | Freq: Two times a day (BID) | OTIC | 0 refills | Status: DC

## 2017-02-08 MED ORDER — ALBUTEROL SULFATE HFA 108 (90 BASE) MCG/ACT IN AERS: 1.0000 | INHALATION_SPRAY | Freq: Four times a day (QID) | RESPIRATORY_TRACT | 1 refills | Status: DC | PRN

## 2017-02-08 MED ORDER — ALBUTEROL SULFATE (2.5 MG/3ML) 0.083% IN NEBU
5.0000 mg | INHALATION_SOLUTION | Freq: Once | RESPIRATORY_TRACT | Status: DC
  Filled 2017-02-08: qty 6

## 2017-02-08 MED ORDER — ALBUTEROL SULFATE HFA 108 (90 BASE) MCG/ACT IN AERS
INHALATION_SPRAY | RESPIRATORY_TRACT | Status: AC
  Filled 2017-02-08: qty 6.7

## 2017-02-08 MED ORDER — IPRATROPIUM-ALBUTEROL 0.5-2.5 (3) MG/3ML IN SOLN
3.0000 mL | Freq: Once | RESPIRATORY_TRACT | Status: AC
  Administered 2017-02-08: 3 mL via RESPIRATORY_TRACT
  Filled 2017-02-08: qty 6

## 2017-02-08 MED ORDER — ALBUTEROL SULFATE HFA 108 (90 BASE) MCG/ACT IN AERS
2.0000 | INHALATION_SPRAY | Freq: Once | RESPIRATORY_TRACT | Status: AC
  Administered 2017-02-08: 2 via RESPIRATORY_TRACT

## 2017-02-08 NOTE — ED Provider Notes (Addendum)
[facility] EMERGENCY DEPARTMENT Provider Note   CSN: [id number] Arrival date & time: 02/08/17  4098     History   Chief Complaint Chief Complaint  Patient presents with  . Cough  . Asthma    HPI Maurice Koch is a 31 y.o. male.  31 year old male presents with complaint of asthma exacerbation. He was seen for same complaint three days prior. He reports compliance with outpatient course of prednisone (50mg  QD). He reports that he woke up this morning with wheezing and tried a neb tx at home without improvement. He denies fever, chest pain, nausea, vomiting, or abdominal pain. He does report mild pain to right ear (that started yesterday).    The history is provided by the patient.  Shortness of Breath  This is a recurrent problem. The average episode lasts 3 days. The problem occurs intermittently.The current episode started more than 2 days ago. The problem has not changed since onset.Associated symptoms include ear pain and wheezing. Pertinent negatives include no fever. He has tried oral steroids and beta-agonist inhalers for the symptoms. The treatment provided mild relief. He has had prior hospitalizations. He has had prior ED visits. Associated medical issues include asthma.    Past Medical History:  Diagnosis Date  . Asthma     There are no active problems to display for this patient.   Past Surgical History:  Procedure Laterality Date  . APPENDECTOMY         Home Medications    Prior to Admission medications   Medication Sig Start Date End Date Taking? Authorizing Provider  albuterol (PROVENTIL HFA;VENTOLIN HFA) 108 (90 BASE) MCG/ACT inhaler Inhale 2 puffs into the lungs every 6 (six) hours as needed for wheezing or shortness of breath.    [provider]  albuterol (PROVENTIL) (2.5 MG/3ML) 0.083% nebulizer solution Take 3 mLs (2.5 mg total) by nebulization every 6 (six) hours as needed for wheezing or shortness of breath. 08/17/14    Everlene Farrier, PA-C  benzonatate (TESSALON) 100 MG capsule Take 1 capsule (100 mg total) by mouth every 8 (eight) hours. 02/06/17   Hedges, Tinnie Gens, PA-C  dicyclomine (BENTYL) 20 MG tablet Take 1 tablet (20 mg total) by mouth 2 (two) times daily as needed for spasms (only if not experiencing constipation). 02/07/15   Gavin Pound, MD  oxyCODONE-acetaminophen (PERCOCET/ROXICET) 5-325 MG tablet Take 2 tablets by mouth every 4 (four) hours as needed for severe pain. 05/22/15   Dowless, Lelon Mast Tripp, PA-C  polyethylene glycol powder (MIRALAX) powder Take 17 g by mouth daily as needed for mild constipation or moderate constipation. 02/07/15   Gavin Pound, MD  predniSONE (DELTASONE) 50 MG tablet Take 1 tablet (50 mg total) by mouth daily. 02/06/17   Eyvonne Mechanic, PA-C    Family History Family History  Problem Relation Age of Onset  . Diabetes Other   . Asthma Other     Social History Social History  Substance Use Topics  . Smoking status: Never Smoker  . Smokeless tobacco: Never Used  . Alcohol use No     Allergies   Shellfish allergy   Review of Systems Review of Systems  Constitutional: Negative for fever.  HENT: Positive for ear pain.   Respiratory: Positive for shortness of breath and wheezing.   All other systems reviewed and are negative.    Physical Exam Updated Vital Signs BP 123/90 (BP Location: Right Arm)   Pulse 98   Temp 98.9 F (37.2 C) (Oral)  Resp 18   Ht 5\' 6"  (1.676 m)   Wt 82.1 kg (181 lb)   SpO2 100%   BMI 29.21 kg/m   Physical Exam  Constitutional: He is oriented to person, place, and time. He appears well-developed and well-nourished.  HENT:  Head: Normocephalic and atraumatic.  Right Ear: Tympanic membrane and external ear normal.  Left Ear: Tympanic membrane, external ear and ear canal normal.  Mouth/Throat: Oropharynx is clear and moist.  Mild erythema of right ear canal noted on exam - TM intact without erythema - No effusion,  Landmarks intact - No FB  Eyes: Pupils are equal, round, and reactive to light. Conjunctivae and EOM are normal.  Neck: Normal range of motion. Neck supple.  Cardiovascular: Normal rate, regular rhythm and normal heart sounds.   No murmur heard. Pulmonary/Chest: Effort normal. No respiratory distress. He has wheezes.  Abdominal: Soft. He exhibits no distension. There is no tenderness.  Musculoskeletal: Normal range of motion. He exhibits no edema.  Neurological: He is alert and oriented to person, place, and time.  Skin: Skin is warm and dry.  Psychiatric: He has a normal mood and affect.  Nursing note and vitals reviewed.    ED Treatments / Results  Labs (all labs ordered are listed, but only abnormal results are displayed) Labs Reviewed - No data to display  EKG  EKG Interpretation  Date/Time:  Friday February 08 2017 07:08:14 EDT Ventricular Rate:  100 PR Interval:    QRS Duration: 101 QT Interval:  369 QTC Calculation: 476 R Axis:   -10 Text Interpretation:  Sinus tachycardia ST elev, probable normal early repol pattern Borderline prolonged QT interval Baseline wander in lead(s) V1 Confirmed by Kristine Royal [phone]) on 02/08/2017 8:36:38 AM     0708 EKG - Sinus Tach HR 100 QRS 101 No acute ischemic changes   Radiology Dg Chest 2 View  Result Date: 02/06/2017 CLINICAL DATA:  Asthma.  Wheezing. EXAM: CHEST  2 VIEW COMPARISON:  02/07/2015 and 08/17/2014 FINDINGS: The heart size and mediastinal contours are within normal limits. Both lungs are clear. The visualized skeletal structures are unremarkable. IMPRESSION: Normal exam. Electronically Signed   By: Francene Boyers M.D.   On: 02/06/2017 15:42    Procedures Procedures (including critical care time)  Medications Ordered in ED Medications  methylPREDNISolone sodium succinate (SOLU-MEDROL) 125 mg/2 mL injection 125 mg (not administered)  ipratropium-albuterol (DUONEB) 0.5-2.5 (3) MG/3ML nebulizer solution 3 mL (not  administered)     Initial Impression / Assessment and Plan / ED Course  I have reviewed the triage vital signs and the nursing notes.  Pertinent labs & imaging results that were available during my care of the patient were reviewed by me and considered in my medical decision making (see chart for details).   0800 Re-Eval following first Neb Tx - Patient improved with minimal wheezing bilaterally on exam - Patient does appear somewhat anxious - Will continue to observe given that he may need repeat Neb tx.   0930 Re-eval - Patient improved - resting comfortably - he desires dc home and declines further observation / treatment in the ED - Lungs are CTA bilaterally without wheezing - He is aware of the need for close FU - Strict return precautions given and understood.     MSE - Screen Complete.   Presentation is consistent with viral URI triggering moderate asthma exacerbation - Pt given an extra dose of IM Solumedrol today - He will complete the remainder of his prednisone  burst at home - He does not have evidence of more significant acute pathology.  He was fairly anxious upon initial evaluation and this may play a part in his return for evaluation today. He is also provided with otic gtts for right ear canal inflammation.    Final Clinical Impressions(s) / ED Diagnoses   Final diagnoses:  Moderate asthma with exacerbation, unspecified whether persistent    New Prescriptions New Prescriptions   CIPROFLOXACIN-HYDROCORTISONE (CIPRO HC) OTIC SUSPENSION    Place 3 drops into the right ear 2 (two) times daily.     Wynetta FinesMessick, Peter C, MD 02/08/17 [phone]    Wynetta FinesMessick, Peter C, MD 02/08/17 [phone]

## 2017-02-08 NOTE — ED Triage Notes (Signed)
Patients to the ED with Asthma exacerbation. Patient reports he was Dx with URI and  Has not been able to keep asthma under control. Patient reports neb treatment every 2-3 hours. Patient reports productive cough.

## 2017-02-10 ENCOUNTER — Observation Stay (HOSPITAL_COMMUNITY)
Admission: EM | Admit: 2017-02-10 | Discharge: 2017-02-11 | Disposition: A | Discharge status: Home / Self Care | Payer: Medicaid Other | Attending: Internal Medicine | Admitting: Internal Medicine

## 2017-02-10 DIAGNOSIS — Z91013 Allergy to seafood: Secondary | ICD-10-CM | POA: Diagnosis not present

## 2017-02-10 DIAGNOSIS — Z79899 Other long term (current) drug therapy: Secondary | ICD-10-CM | POA: Insufficient documentation

## 2017-02-10 DIAGNOSIS — F329 Major depressive disorder, single episode, unspecified: Secondary | ICD-10-CM | POA: Diagnosis not present

## 2017-02-10 DIAGNOSIS — Z7952 Long term (current) use of systemic steroids: Secondary | ICD-10-CM | POA: Diagnosis not present

## 2017-02-10 DIAGNOSIS — Z825 Family history of asthma and other chronic lower respiratory diseases: Secondary | ICD-10-CM | POA: Diagnosis not present

## 2017-02-10 DIAGNOSIS — E876 Hypokalemia: Secondary | ICD-10-CM | POA: Diagnosis not present

## 2017-02-10 DIAGNOSIS — A419 Sepsis, unspecified organism: Secondary | ICD-10-CM

## 2017-02-10 DIAGNOSIS — J4532 Mild persistent asthma with status asthmaticus: Secondary | ICD-10-CM | POA: Diagnosis present

## 2017-02-10 DIAGNOSIS — H109 Unspecified conjunctivitis: Secondary | ICD-10-CM | POA: Diagnosis not present

## 2017-02-10 DIAGNOSIS — J45901 Unspecified asthma with (acute) exacerbation: Secondary | ICD-10-CM | POA: Diagnosis not present

## 2017-02-10 DIAGNOSIS — Z833 Family history of diabetes mellitus: Secondary | ICD-10-CM | POA: Insufficient documentation

## 2017-02-10 DIAGNOSIS — Z9049 Acquired absence of other specified parts of digestive tract: Secondary | ICD-10-CM | POA: Diagnosis not present

## 2017-02-10 HISTORY — DX: Anxiety disorder, unspecified: F41.9

## 2017-02-10 HISTORY — DX: Depression, unspecified: F32.A

## 2017-02-10 HISTORY — DX: Major depressive disorder, single episode, unspecified: F32.9

## 2017-02-10 LAB — BASIC METABOLIC PANEL
ANION GAP: 11 (ref 5–15)
BUN: 12 mg/dL (ref 6–20)
CO2: 23 mmol/L (ref 22–32)
Calcium: 8.9 mg/dL (ref 8.9–10.3)
Chloride: 102 mmol/L (ref 101–111)
Creatinine, Ser: 1.12 mg/dL (ref 0.61–1.24)
GLUCOSE: 146 mg/dL — AB (ref 65–99)
POTASSIUM: 3 mmol/L — AB (ref 3.5–5.1)
SODIUM: 136 mmol/L (ref 135–145)

## 2017-02-10 LAB — CBC WITH DIFFERENTIAL/PLATELET
BASOS PCT: 0 %
Basophils Absolute: 0 10*3/uL (ref 0.0–0.1)
EOS ABS: 0.3 10*3/uL (ref 0.0–0.7)
EOS PCT: 2 %
HCT: 43.6 % (ref 39.0–52.0)
Hemoglobin: 14.3 g/dL (ref 13.0–17.0)
Lymphocytes Relative: 18 %
Lymphs Abs: 2.6 10*3/uL (ref 0.7–4.0)
MCH: 26.3 pg (ref 26.0–34.0)
MCHC: 32.8 g/dL (ref 30.0–36.0)
MCV: 80.1 fL (ref 78.0–100.0)
MONO ABS: 1.5 10*3/uL — AB (ref 0.1–1.0)
MONOS PCT: 11 %
NEUTROS ABS: 9.9 10*3/uL — AB (ref 1.7–7.7)
NEUTROS PCT: 69 %
Platelets: 275 10*3/uL (ref 150–400)
RBC: 5.44 MIL/uL (ref 4.22–5.81)
RDW: 14.2 % (ref 11.5–15.5)
WBC: 14.3 10*3/uL — AB (ref 4.0–10.5)

## 2017-02-10 LAB — INFLUENZA PANEL BY PCR (TYPE A & B)
INFLAPCR: NEGATIVE
INFLBPCR: NEGATIVE

## 2017-02-10 MED ORDER — ALBUTEROL (5 MG/ML) CONTINUOUS INHALATION SOLN
INHALATION_SOLUTION | RESPIRATORY_TRACT | Status: AC
  Administered 2017-02-10: 15 mg via RESPIRATORY_TRACT
  Filled 2017-02-10: qty 3

## 2017-02-10 MED ORDER — ENOXAPARIN SODIUM 40 MG/0.4ML ~~LOC~~ SOLN
40.0000 mg | SUBCUTANEOUS | Status: DC
  Administered 2017-02-11: 40 mg via SUBCUTANEOUS
  Filled 2017-02-10 (×2): qty 0.4

## 2017-02-10 MED ORDER — ACETAMINOPHEN 325 MG PO TABS: 650.0000 mg | ORAL_TABLET | Freq: Four times a day (QID) | ORAL | Status: DC | PRN

## 2017-02-10 MED ORDER — MONTELUKAST SODIUM 10 MG PO TABS
10.0000 mg | ORAL_TABLET | Freq: Every day | ORAL | Status: DC
Start: 2017-02-11 — End: 2017-02-11
  Administered 2017-02-11: 10 mg via ORAL
  Filled 2017-02-10: qty 1

## 2017-02-10 MED ORDER — CIPROFLOXACIN-HYDROCORTISONE 0.2-1 % OT SUSP
3.0000 [drp] | Freq: Two times a day (BID) | OTIC | Status: DC
  Administered 2017-02-11 (×2): 3 [drp] via OTIC
  Filled 2017-02-10: qty 10

## 2017-02-10 MED ORDER — LORATADINE 10 MG PO TABS
10.0000 mg | ORAL_TABLET | Freq: Every day | ORAL | Status: DC
  Administered 2017-02-11: 10 mg via ORAL
  Filled 2017-02-10: qty 1

## 2017-02-10 MED ORDER — PREDNISONE 20 MG PO TABS
60.0000 mg | ORAL_TABLET | Freq: Once | ORAL | Status: AC
  Administered 2017-02-10: 60 mg via ORAL
  Filled 2017-02-10: qty 3

## 2017-02-10 MED ORDER — SODIUM CHLORIDE 0.9 % IV BOLUS (SEPSIS)
2000.0000 mL | Freq: Once | INTRAVENOUS | Status: AC
  Administered 2017-02-11: 2000 mL via INTRAVENOUS

## 2017-02-10 MED ORDER — ESCITALOPRAM OXALATE 20 MG PO TABS
20.0000 mg | ORAL_TABLET | Freq: Every day | ORAL | Status: DC
  Administered 2017-02-11: 20 mg via ORAL
  Filled 2017-02-10: qty 1

## 2017-02-10 MED ORDER — ONDANSETRON HCL 4 MG/2ML IJ SOLN: 4.0000 mg | Freq: Three times a day (TID) | INTRAMUSCULAR | Status: DC | PRN

## 2017-02-10 MED ORDER — AZITHROMYCIN 500 MG PO TABS
500.0000 mg | ORAL_TABLET | Freq: Every day | ORAL | Status: AC
Start: 2017-02-10 — End: 2017-02-11
  Administered 2017-02-11: 500 mg via ORAL
  Filled 2017-02-10: qty 1

## 2017-02-10 MED ORDER — ALBUTEROL SULFATE (2.5 MG/3ML) 0.083% IN NEBU
INHALATION_SOLUTION | RESPIRATORY_TRACT | Status: AC
  Filled 2017-02-10: qty 6

## 2017-02-10 MED ORDER — METHYLPREDNISOLONE SODIUM SUCC 125 MG IJ SOLR
60.0000 mg | Freq: Three times a day (TID) | INTRAMUSCULAR | Status: DC
  Administered 2017-02-11 (×2): 60 mg via INTRAVENOUS
  Filled 2017-02-10 (×2): qty 2

## 2017-02-10 MED ORDER — IPRATROPIUM BROMIDE 0.02 % IN SOLN
0.5000 mg | RESPIRATORY_TRACT | Status: DC
  Administered 2017-02-11: 0.5 mg via RESPIRATORY_TRACT
  Filled 2017-02-10: qty 2.5

## 2017-02-10 MED ORDER — POLYETHYLENE GLYCOL 3350 17 G PO PACK: 17.0000 g | PACK | Freq: Every day | ORAL | Status: DC | PRN

## 2017-02-10 MED ORDER — LEVALBUTEROL HCL 1.25 MG/0.5ML IN NEBU
1.2500 mg | INHALATION_SOLUTION | Freq: Four times a day (QID) | RESPIRATORY_TRACT | Status: DC
  Administered 2017-02-11 (×3): 1.25 mg via RESPIRATORY_TRACT
  Filled 2017-02-10 (×7): qty 0.5

## 2017-02-10 MED ORDER — ALBUTEROL SULFATE (2.5 MG/3ML) 0.083% IN NEBU
5.0000 mg | INHALATION_SOLUTION | Freq: Once | RESPIRATORY_TRACT | Status: AC
  Administered 2017-02-10: 5 mg via RESPIRATORY_TRACT

## 2017-02-10 MED ORDER — ALBUTEROL (5 MG/ML) CONTINUOUS INHALATION SOLN
15.0000 mg | INHALATION_SOLUTION | Freq: Once | RESPIRATORY_TRACT | Status: AC
  Administered 2017-02-10: 15 mg via RESPIRATORY_TRACT

## 2017-02-10 MED ORDER — ACETAMINOPHEN 500 MG PO TABS
1000.0000 mg | ORAL_TABLET | Freq: Once | ORAL | Status: AC
  Administered 2017-02-10: 1000 mg via ORAL
  Filled 2017-02-10: qty 2

## 2017-02-10 MED ORDER — AZITHROMYCIN 500 MG PO TABS: 250.0000 mg | ORAL_TABLET | Freq: Every day | ORAL | Status: DC

## 2017-02-10 MED ORDER — SODIUM CHLORIDE 0.9 % IV SOLN
INTRAVENOUS | Status: DC
  Administered 2017-02-11: 01:00:00 via INTRAVENOUS

## 2017-02-10 MED ORDER — TOBRAMYCIN 0.3 % OP SOLN
1.0000 [drp] | OPHTHALMIC | Status: DC
  Administered 2017-02-10 – 2017-02-11 (×6): 1 [drp] via OPHTHALMIC
  Filled 2017-02-10 (×2): qty 5

## 2017-02-10 MED ORDER — DM-GUAIFENESIN ER 30-600 MG PO TB12: 1.0000 | ORAL_TABLET | Freq: Two times a day (BID) | ORAL | Status: DC | PRN

## 2017-02-10 MED ORDER — ZOLPIDEM TARTRATE 5 MG PO TABS: 5.0000 mg | ORAL_TABLET | Freq: Every evening | ORAL | Status: DC | PRN

## 2017-02-10 MED ORDER — POTASSIUM CHLORIDE CRYS ER 20 MEQ PO TBCR
40.0000 meq | EXTENDED_RELEASE_TABLET | Freq: Once | ORAL | Status: AC
  Administered 2017-02-10: 40 meq via ORAL
  Filled 2017-02-10: qty 2

## 2017-02-10 NOTE — ED Notes (Signed)
Admitting at bedside 

## 2017-02-10 NOTE — ED Notes (Signed)
Pt ambulatory to restroom with steady gait.

## 2017-02-10 NOTE — ED Notes (Signed)
Patient is stable and ready to be transport to the floor at this time.  Report was called to 5W RN.  Belongings taken with the patient to the floor.   

## 2017-02-10 NOTE — H&P (Signed)
History and Physical    Maurice Koch ZOX:[id number] DOB: [birth year] DOA: 02/10/2017  Referring MD/NP/PA:   PCP: [facility], [facility]   Patient coming from:  The patient is coming from home.  At baseline, pt is independent for most of ADL.  Chief Complaint: Shortness of breath, cough and wheezing  HPI: Maurice Koch is a 31 y.o. male with medical history significant of asthma, depression, who presents with shortness breath, cough and wheezing.  Patient states that he has been having shortness breath, cough, wheezing for almost a week, which has been progressively getting worse. He was seen in ED several times, and was given prednisone prescription 4 days ago, without significant improvement. He coughs up little yellow colored sputum. Patient speaks in full sentence. He states that he has some chest pain which is induced by coughing. No fever or chills. Patient states that he feels clogged in both ear, no ear discharge or pain. No hearing loss. He also has right eye greenish colored discharge and red eye. No eye pain or vision loss. Patient does not have nausea, vomiting, diarrhea, abdominal pain, symptoms of UTI or unilateral weakness.  ED Course: pt was found to have WBC 14.3, lactic acid is 3.1, potassium 3.0, creatinine 1.12, temperature normal,  tachycardia, tachypnea, oxygen saturation 96% on room air. Chest x-rays negative for infiltration. Patient is admitted to telemetry bed as inpatient.  Review of Systems:   General: no fevers, chills, no body weight gain, has poor appetite, has fatigue HEENT: no blurry vision, hearing changes or sore throat. Has conjunctival erythema and yellowish discharge in right eye. Respiratory: has dyspnea, coughing, wheezing CV: has chest pain, no palpitations GI: no nausea, vomiting, abdominal pain, diarrhea, constipation GU: no dysuria, burning on urination, increased urinary frequency, hematuria  Ext: no leg edema Neuro: no  unilateral weakness, numbness, or tingling, no vision change or hearing loss Skin: no rash, no skin tear. MSK: No muscle spasm, no deformity, no limitation of range of movement in spin Heme: No easy bruising.  Travel history: No recent long distant travel.  Allergy:  Allergies  Allergen Reactions  . Shellfish Allergy Hives    Past Medical History:  Diagnosis Date  . Asthma     Past Surgical History:  Procedure Laterality Date  . APPENDECTOMY      Social History:  reports that  has never smoked. he has never used smokeless tobacco. He reports that he does not drink alcohol or use drugs.  Family History:  Family History  Problem Relation Age of Onset  . Diabetes Other   . Asthma Other      Prior to Admission medications   Medication Sig Start Date End Date Taking? Authorizing Provider  albuterol (PROVENTIL HFA;VENTOLIN HFA) 108 (90 BASE) MCG/ACT inhaler Inhale 2 puffs into the lungs every 6 (six) hours as needed for wheezing or shortness of breath.    [provider]  albuterol (PROVENTIL HFA;VENTOLIN HFA) 108 (90 Base) MCG/ACT inhaler Inhale 1-2 puffs into the lungs every 6 (six) hours as needed for wheezing or shortness of breath. 02/08/17   Wynetta Fines, MD  albuterol (PROVENTIL) (2.5 MG/3ML) 0.083% nebulizer solution Take 3 mLs (2.5 mg total) by nebulization every 6 (six) hours as needed for wheezing or shortness of breath. 08/17/14   Everlene Farrier, PA-C  benzonatate (TESSALON) 100 MG capsule Take 1 capsule (100 mg total) by mouth every 8 (eight) hours. 02/06/17   Hedges, Tinnie Gens, PA-C  ciprofloxacin-hydrocortisone (CIPRO HC) OTIC  suspension Place 3 drops into the right ear 2 (two) times daily. 02/08/17   Wynetta FinesMessick, Peter C, MD  dicyclomine (BENTYL) 20 MG tablet Take 1 tablet (20 mg total) by mouth 2 (two) times daily as needed for spasms (only if not experiencing constipation). Patient not taking: Reported on 02/08/2017 02/07/15   Gavin PoundBrooten, Justin, MD  montelukast  (SINGULAIR) 10 MG tablet Take 10 mg by mouth at bedtime.    [provider]  oxyCODONE-acetaminophen (PERCOCET/ROXICET) 5-325 MG tablet Take 2 tablets by mouth every 4 (four) hours as needed for severe pain. Patient not taking: Reported on 02/08/2017 05/22/15   Dowless, Lelon MastSamantha Tripp, PA-C  polyethylene glycol powder (MIRALAX) powder Take 17 g by mouth daily as needed for mild constipation or moderate constipation. Patient not taking: Reported on 02/08/2017 02/07/15   Gavin PoundBrooten, Justin, MD  predniSONE (DELTASONE) 50 MG tablet Take 1 tablet (50 mg total) by mouth daily. 02/06/17   Eyvonne MechanicHedges, Jeffrey, PA-C    Physical Exam: Vitals:   02/10/17 1946 02/10/17 2001 02/10/17 2349 02/11/17 0100  BP: 132/81  129/68   Pulse: (!) 107  91   Resp: 16  18   Temp: 98.8 F (37.1 C)  98.6 F (37 C)   TempSrc: Oral  Oral   SpO2: 97% 96% 99% 98%  Weight:   79.9 kg (176 lb 3.2 oz)   Height:   5\' 6"  (1.676 m)    General: Not in acute distress HEENT:       Eyes: PERRL, EOMI, no scleral icterus.       ENT: no pharynx injection, no tonsillar enlargement. Has conjunctival erythema and yellowish discharge in right eye.       Neck: No JVD, no bruit, no mass felt. Heme: No neck lymph node enlargement. Cardiac: S1/S2, RRR, No murmurs, No gallops or rubs. Respiratory: Has expiratory wheezing bilaterally. GI: Soft, nondistended, nontender, no rebound pain, no organomegaly, BS present. GU: No hematuria Ext: No pitting leg edema bilaterally. 2+DP/PT pulse bilaterally. Musculoskeletal: No joint deformities, No joint redness or warmth, no limitation of ROM in spin. Skin: No rashes.  Neuro: Alert, oriented X3, cranial nerves II-XII grossly intact, moves all extremities normally.  Psych: Patient is not psychotic, no suicidal or hemocidal ideation.  Labs on Admission: I have personally reviewed following labs and imaging studies  CBC: Recent Labs  Lab 02/10/17 1944  WBC 14.3*  NEUTROABS 9.9*  HGB 14.3    HCT 43.6  MCV 80.1  PLT 275   Basic Metabolic Panel: Recent Labs  Lab 02/10/17 1944 02/11/17 0037  NA 136  --   K 3.0*  --   CL 102  --   CO2 23  --   GLUCOSE 146*  --   BUN 12  --   CREATININE 1.12  --   CALCIUM 8.9  --   MG  --  2.0   GFR: Estimated Creatinine Clearance: 94.9 mL/min (by C-G formula based on SCr of 1.12 mg/dL). Liver Function Tests: No results for input(s): AST, ALT, ALKPHOS, BILITOT, PROT, ALBUMIN in the last 168 hours. No results for input(s): LIPASE, AMYLASE in the last 168 hours. No results for input(s): AMMONIA in the last 168 hours. Coagulation Profile: No results for input(s): INR, PROTIME in the last 168 hours. Cardiac Enzymes: No results for input(s): CKTOTAL, CKMB, CKMBINDEX, TROPONINI in the last 168 hours. BNP (last 3 results) No results for input(s): PROBNP in the last 8760 hours. HbA1C: No results for input(s): HGBA1C in the last  72 hours. CBG: No results for input(s): GLUCAP in the last 168 hours. Lipid Profile: No results for input(s): CHOL, HDL, LDLCALC, TRIG, CHOLHDL, LDLDIRECT in the last 72 hours. Thyroid Function Tests: No results for input(s): TSH, T4TOTAL, FREET4, T3FREE, THYROIDAB in the last 72 hours. Anemia Panel: No results for input(s): VITAMINB12, FOLATE, FERRITIN, TIBC, IRON, RETICCTPCT in the last 72 hours. Urine analysis:    Component Value Date/Time   COLORURINE YELLOW 02/07/2015 1020   APPEARANCEUR CLEAR 02/07/2015 1020   LABSPEC 1.026 02/07/2015 1020   PHURINE 7.5 02/07/2015 1020   GLUCOSEU 100 (A) 02/07/2015 1020   HGBUR NEGATIVE 02/07/2015 1020   BILIRUBINUR NEGATIVE 02/07/2015 1020   KETONESUR 15 (A) 02/07/2015 1020   PROTEINUR NEGATIVE 02/07/2015 1020   UROBILINOGEN 1.0 02/07/2015 1020   NITRITE NEGATIVE 02/07/2015 1020   LEUKOCYTESUR NEGATIVE 02/07/2015 1020   Sepsis Labs: @LABRCNTIP (procalcitonin:4,lacticidven:4) )No results found for this or any previous visit (from the past 240 hour(s)).    Radiological Exams on Admission: No results found.   EKG: Independently reviewed.  Sinus rhythm, tachycardia, LAD, QTC 476  Assessment/Plan Principal Problem:   Asthma exacerbation Active Problems:   Hypokalemia   Conjunctivitis-right eye   Sepsis (HCC)   Asthma exacerbation and sepsis: Chest x-rays negative for infiltration. Patient meets criteria for sepsis with leukocytosis, tachycardia, tachypnea. Lactic acid is elevated at 3.1. Currently hemodynamically stable.  -will place on tele bed for obs -Nebulizers: Scheduled Atrovent and prn Xopenex nebs -Solu-Medrol 80 mg IV q8h  -Mucinex for cough  -Urine S. pneumococcal antigen -Follow up blood culture x2, sputum culture, respiratory virus panel, Flu pcr -will get Procalcitonin and trend lactic acid levels per sepsis protocol. -IVF: 3L of NS bolus in ED, followed by 125 cc/h   Hypokalemia: K= 3.0  on admission. - Repleted - Check Mg level  Conjunctivitis-right eye: -tobramycin eye drop  Ear clog feeling: -Empiric Cipro-HC Otic drop   DVT ppx: SQ Lovenox Code Status: Full code Family Communication:  Yes, patient's  fianc  at bed side Disposition Plan:  Anticipate discharge back to previous home environment Consults called:  none Admission status:  Inpatient/tele      Date of Service 02/11/2017    Lorretta Harp [facility] Pager [phone]  If 7PM-7AM, please contact night-coverage [email or web address] Password [facility] 02/11/2017, 5:33 AM

## 2017-02-10 NOTE — ED Provider Notes (Addendum)
[facility] EMERGENCY DEPARTMENT Provider Note   CSN: [id number] Arrival date & time: 02/10/17  1644     History   Chief Complaint Chief Complaint  Patient presents with  . Asthma  . URI  . Conjunctivitis    HPI Maurice Koch is a 31 y.o. male.  HPI complains of Nonproductive cough, shortness of breath onset 5 days ago.  Associated symptoms include wheezing.  Patient was started on prednisone 4 days ago.  He is also been using his albuterol home nebulizer and inhaler, without relief.  Today he developed discharge from his right eye and he states his right eyelashes were sealed shut upon awakening this morning.  Also feels as if his ears are clogged.  No other associated symptoms.  Maximum temperature 100 degrees.  Seen in the ED on 02/06/2017 prescribed prednisone and Tessalon Perles.  Seen again in the ED on 02/08/2017, received Solu-Medrol and DuoNeb.  Nothing makes symptoms better or worse.  No other associated symptoms Past Medical History:  Diagnosis Date  . Asthma   Approximately 2 ED visits per year.  Intubated at age 89.  There are no active problems to display for this patient.   Past Surgical History:  Procedure Laterality Date  . APPENDECTOMY         Home Medications    Prior to Admission medications   Medication Sig Start Date End Date Taking? Authorizing Provider  albuterol (PROVENTIL HFA;VENTOLIN HFA) 108 (90 BASE) MCG/ACT inhaler Inhale 2 puffs into the lungs every 6 (six) hours as needed for wheezing or shortness of breath.    [provider]  albuterol (PROVENTIL HFA;VENTOLIN HFA) 108 (90 Base) MCG/ACT inhaler Inhale 1-2 puffs into the lungs every 6 (six) hours as needed for wheezing or shortness of breath. 02/08/17   Wynetta Fines, MD  albuterol (PROVENTIL) (2.5 MG/3ML) 0.083% nebulizer solution Take 3 mLs (2.5 mg total) by nebulization every 6 (six) hours as needed for wheezing or shortness of breath. 08/17/14   Everlene Farrier, PA-C  benzonatate (TESSALON) 100 MG capsule Take 1 capsule (100 mg total) by mouth every 8 (eight) hours. 02/06/17   Hedges, Tinnie Gens, PA-C  ciprofloxacin-hydrocortisone (CIPRO HC) OTIC suspension Place 3 drops into the right ear 2 (two) times daily. 02/08/17   Wynetta Fines, MD  dicyclomine (BENTYL) 20 MG tablet Take 1 tablet (20 mg total) by mouth 2 (two) times daily as needed for spasms (only if not experiencing constipation). Patient not taking: Reported on 02/08/2017 02/07/15   Gavin Pound, MD  montelukast (SINGULAIR) 10 MG tablet Take 10 mg by mouth at bedtime.    [provider]  oxyCODONE-acetaminophen (PERCOCET/ROXICET) 5-325 MG tablet Take 2 tablets by mouth every 4 (four) hours as needed for severe pain. Patient not taking: Reported on 02/08/2017 05/22/15   Dowless, Lelon Mast Tripp, PA-C  polyethylene glycol powder (MIRALAX) powder Take 17 g by mouth daily as needed for mild constipation or moderate constipation. Patient not taking: Reported on 02/08/2017 02/07/15   Gavin Pound, MD  predniSONE (DELTASONE) 50 MG tablet Take 1 tablet (50 mg total) by mouth daily. 02/06/17   Eyvonne Mechanic, PA-C    Family History Family History  Problem Relation Age of Onset  . Diabetes Other   . Asthma Other     Social History Social History   Tobacco Use  . Smoking status: Never Smoker  . Smokeless tobacco: Never Used  Substance Use Topics  . Alcohol use: No  . Drug  use: No   A passive smoker.  Mother smokes in the house  Allergies   Shellfish allergy   Review of Systems Review of Systems  HENT: Positive for ear pain.   Eyes: Positive for discharge.  Respiratory: Positive for cough, shortness of breath and wheezing.   All other systems reviewed and are negative.    Physical Exam Updated Vital Signs BP 117/79 (BP Location: Right Arm)   Pulse 90   Temp 98.5 F (36.9 C) (Oral)   Resp 20   Ht 5\' 6"  (1.676 m)   Wt 82.1 kg (181 lb)   SpO2 99%   BMI  29.21 kg/m   Physical Exam  Constitutional: He is oriented to person, place, and time. He appears well-developed and well-nourished. He appears distressed.  Mild respiratory distress  HENT:  Head: Normocephalic and atraumatic.  Eyes: Conjunctivae are normal. Pupils are equal, round, and reactive to light.  With some conjunctival erythema and yellowish discharge at the medial canthus  Neck: Neck supple. No tracheal deviation present. No thyromegaly present.  Cardiovascular: Normal rate and regular rhythm.  No murmur heard. Pulmonary/Chest: He is in respiratory distress. He has wheezes.  Coughing occasionally.  Mild respiratory distress.  End expiratory wheezes  Abdominal: Soft. Bowel sounds are normal. He exhibits no distension. There is no tenderness.  Musculoskeletal: Normal range of motion. He exhibits no edema or tenderness.  Neurological: He is alert and oriented to person, place, and time. Coordination normal.  Skin: Skin is warm and dry. No rash noted.  Psychiatric: He has a normal mood and affect.  Nursing note and vitals reviewed.    ED Treatments / Results  Labs (all labs ordered are listed, but only abnormal results are displayed) Labs Reviewed  BASIC METABOLIC PANEL  CBC WITH DIFFERENTIAL/PLATELET    EKG  EKG Interpretation None       Radiology No results found.  Procedures Procedures (including critical care time)  Medications Ordered in ED Medications  albuterol (PROVENTIL,VENTOLIN) solution continuous neb (not administered)  predniSONE (DELTASONE) tablet 60 mg (not administered)  acetaminophen (TYLENOL) tablet 1,000 mg (not administered)  albuterol (PROVENTIL) (2.5 MG/3ML) 0.083% nebulizer solution 5 mg (5 mg Nebulization Given 02/10/17 1738)     Initial Impression / Assessment and Plan / ED Course  I have reviewed the triage vital signs and the nursing notes.  Pertinent labs & imaging results that were available during my care of the patient were  reviewed by me and considered in my medical decision making (see chart for details).    9:25 PM patient's breathing is slightly improved but not at baseline.  He still appears in mild respiratory distress.  Lungs with expiratory wheezes.  After treatment with continuous nebulization and prednisone, and Tylenol. Chest x-ray from 02/08/2017 reviewed by me, Results for orders placed or performed during the hospital encounter of 02/10/17  Basic metabolic panel  Result Value Ref Range   Sodium 136 135 - 145 mmol/L   Potassium 3.0 (L) 3.5 - 5.1 mmol/L   Chloride 102 101 - 111 mmol/L   CO2 23 22 - 32 mmol/L   Glucose, Bld 146 (H) 65 - 99 mg/dL   BUN 12 6 - 20 mg/dL   Creatinine, Ser 1.61 0.61 - 1.24 mg/dL   Calcium 8.9 8.9 - 09.6 mg/dL   GFR calc non Af Amer >60 >60 mL/min   GFR calc Af Amer >60 >60 mL/min   Anion gap 11 5 - 15  CBC with Differential/Platelet  Result Value Ref Range   WBC 14.3 (H) 4.0 - 10.5 K/uL   RBC 5.44 4.22 - 5.81 MIL/uL   Hemoglobin 14.3 13.0 - 17.0 g/dL   HCT 16.143.6 09.639.0 - 04.552.0 %   MCV 80.1 78.0 - 100.0 fL   MCH 26.3 26.0 - 34.0 pg   MCHC 32.8 30.0 - 36.0 g/dL   RDW 40.914.2 81.111.5 - 91.415.5 %   Platelets 275 150 - 400 K/uL   Neutrophils Relative % 69 %   Neutro Abs 9.9 (H) 1.7 - 7.7 K/uL   Lymphocytes Relative 18 %   Lymphs Abs 2.6 0.7 - 4.0 K/uL   Monocytes Relative 11 %   Monocytes Absolute 1.5 (H) 0.1 - 1.0 K/uL   Eosinophils Relative 2 %   Eosinophils Absolute 0.3 0.0 - 0.7 K/uL   Basophils Relative 0 %   Basophils Absolute 0.0 0.0 - 0.1 K/uL   Dg Chest 2 View  Result Date: 02/08/2017 CLINICAL DATA:  31 year old male with shortness of breath, midline chest pain, congestion and productive cough. EXAM: CHEST  2 VIEW COMPARISON:  02/06/2017 FINDINGS: The heart size and mediastinal contours are within normal limits. Both lungs are clear. The visualized skeletal structures are unremarkable. IMPRESSION: No active cardiopulmonary disease. Electronically Signed   By:  Sande BrothersSerena  Chacko M.D.   On: 02/08/2017 07:41   Dg Chest 2 View  Result Date: 02/06/2017 CLINICAL DATA:  Asthma.  Wheezing. EXAM: CHEST  2 VIEW COMPARISON:  02/07/2015 and 08/17/2014 FINDINGS: The heart size and mediastinal contours are within normal limits. Both lungs are clear. The visualized skeletal structures are unremarkable. IMPRESSION: Normal exam. Electronically Signed   By: Francene BoyersJames  Maxwell M.D.   On: 02/06/2017 15:42  Patient tobramycin eyedrops ordered by me The patient will require overnight stay, more nebulized treatments.  Hospitalist physician Dr.Niu consulted by me and will see patient in the emergency department to arrange for overnight stay Final Clinical Impressions(s) / ED Diagnoses  Diagnosis #1 asthmatic bronchitis #2 status asthmaticus #3 conjunctivitis of right eye Final diagnoses:  None    New Prescriptions This SmartLink is deprecated. Use AVSMEDLIST instead to display the medication list for a patient.   Doug SouJacubowitz, Subhan Hoopes, MD 02/10/17 2207    Doug SouJacubowitz, Alem Fahl, MD 02/10/17 2209

## 2017-02-10 NOTE — ED Triage Notes (Addendum)
Seen here for asthma several days ago. Pt states lasting asthma exacerbation, diagnosis with URI. Pt states new onset of watery eyes, yellow drainage from his eyes that he has not been evaluated for yet. Pt used neb prior to arrival. Afebrile at triage. Also wants his ears assessed. Pt states he feels like his right ear is clogged. Wheezing to lungs noted upon assessment.

## 2017-02-10 NOTE — ED Notes (Signed)
EDP at bedside  

## 2017-02-11 ENCOUNTER — Other Ambulatory Visit: Payer: Self-pay

## 2017-02-11 ENCOUNTER — Encounter (HOSPITAL_COMMUNITY): Payer: Self-pay

## 2017-02-11 DIAGNOSIS — E876 Hypokalemia: Secondary | ICD-10-CM | POA: Diagnosis not present

## 2017-02-11 DIAGNOSIS — H109 Unspecified conjunctivitis: Secondary | ICD-10-CM | POA: Diagnosis not present

## 2017-02-11 DIAGNOSIS — J45901 Unspecified asthma with (acute) exacerbation: Secondary | ICD-10-CM | POA: Diagnosis not present

## 2017-02-11 LAB — BASIC METABOLIC PANEL
ANION GAP: 11 (ref 5–15)
BUN: 10 mg/dL (ref 6–20)
CALCIUM: 9 mg/dL (ref 8.9–10.3)
CO2: 18 mmol/L — ABNORMAL LOW (ref 22–32)
Chloride: 109 mmol/L (ref 101–111)
Creatinine, Ser: 0.99 mg/dL (ref 0.61–1.24)
GLUCOSE: 196 mg/dL — AB (ref 65–99)
POTASSIUM: 3.7 mmol/L (ref 3.5–5.1)
SODIUM: 138 mmol/L (ref 135–145)

## 2017-02-11 LAB — RESPIRATORY PANEL BY PCR
Adenovirus: NOT DETECTED
BORDETELLA PERTUSSIS-RVPCR: NOT DETECTED
CHLAMYDOPHILA PNEUMONIAE-RVPPCR: NOT DETECTED
CORONAVIRUS HKU1-RVPPCR: NOT DETECTED
Coronavirus 229E: NOT DETECTED
Coronavirus NL63: NOT DETECTED
Coronavirus OC43: NOT DETECTED
INFLUENZA A-RVPPCR: NOT DETECTED
Influenza B: NOT DETECTED
METAPNEUMOVIRUS-RVPPCR: NOT DETECTED
Mycoplasma pneumoniae: NOT DETECTED
PARAINFLUENZA VIRUS 2-RVPPCR: NOT DETECTED
PARAINFLUENZA VIRUS 3-RVPPCR: NOT DETECTED
PARAINFLUENZA VIRUS 4-RVPPCR: NOT DETECTED
Parainfluenza Virus 1: NOT DETECTED
RESPIRATORY SYNCYTIAL VIRUS-RVPPCR: NOT DETECTED
RHINOVIRUS / ENTEROVIRUS - RVPPCR: DETECTED — AB

## 2017-02-11 LAB — MAGNESIUM: Magnesium: 2 mg/dL (ref 1.7–2.4)

## 2017-02-11 LAB — LACTIC ACID, PLASMA
LACTIC ACID, VENOUS: 2.3 mmol/L — AB (ref 0.5–1.9)
LACTIC ACID, VENOUS: 3.1 mmol/L — AB (ref 0.5–1.9)

## 2017-02-11 LAB — STREP PNEUMONIAE URINARY ANTIGEN: STREP PNEUMO URINARY ANTIGEN: NEGATIVE

## 2017-02-11 LAB — HIV ANTIBODY (ROUTINE TESTING W REFLEX): HIV SCREEN 4TH GENERATION: NONREACTIVE

## 2017-02-11 LAB — PROCALCITONIN: Procalcitonin: <0.1 ng/mL

## 2017-02-11 MED ORDER — SODIUM CHLORIDE 0.9 % IV BOLUS (SEPSIS)
1000.0000 mL | Freq: Once | INTRAVENOUS | Status: AC
  Administered 2017-02-11: 1000 mL via INTRAVENOUS

## 2017-02-11 MED ORDER — ALBUTEROL SULFATE (2.5 MG/3ML) 0.083% IN NEBU: 5.0000 mg | INHALATION_SOLUTION | RESPIRATORY_TRACT | Status: DC | PRN

## 2017-02-11 MED ORDER — AZITHROMYCIN 250 MG PO TABS: 250.0000 mg | ORAL_TABLET | Freq: Every day | ORAL | 0 refills | Status: DC

## 2017-02-11 MED ORDER — IPRATROPIUM BROMIDE 0.02 % IN SOLN: 0.5000 mg | Freq: Four times a day (QID) | RESPIRATORY_TRACT | 0 refills | Status: AC

## 2017-02-11 MED ORDER — IPRATROPIUM BROMIDE 0.02 % IN SOLN
0.5000 mg | Freq: Four times a day (QID) | RESPIRATORY_TRACT | Status: DC
  Administered 2017-02-11 (×2): 0.5 mg via RESPIRATORY_TRACT
  Filled 2017-02-11 (×3): qty 2.5

## 2017-02-11 MED ORDER — TOBRAMYCIN 0.3 % OP SOLN: 1.0000 [drp] | OPHTHALMIC | 0 refills | Status: DC

## 2017-02-11 MED ORDER — PREDNISONE 10 MG PO TABS: ORAL_TABLET | ORAL | 0 refills | Status: DC

## 2017-02-11 MED ORDER — PREDNISONE 50 MG PO TABS: 50.0000 mg | ORAL_TABLET | Freq: Every day | ORAL | Status: DC

## 2017-02-11 MED ORDER — POTASSIUM CHLORIDE CRYS ER 20 MEQ PO TBCR
40.0000 meq | EXTENDED_RELEASE_TABLET | Freq: Once | ORAL | Status: AC
  Administered 2017-02-11: 40 meq via ORAL
  Filled 2017-02-11: qty 2

## 2017-02-11 NOTE — Progress Notes (Signed)
CRITICAL VALUE ALERT  Critical Value:  Lactic acid 3.1  Date & Time Notied:  02/11/2017 0155  Provider Notified: X. Blount  Orders Received/Actions taken: No new orders received.

## 2017-02-11 NOTE — Discharge Summary (Addendum)
Physician Discharge Summary  BRAYDAN MARRIOTT BJY:[id number] DOB: 1986-01-13 DOA: 02/10/2017  PCP: [facility]  Admit date: 02/10/2017 Discharge date: 02/11/2017   Recommendations for Outpatient Follow-Up:   Supportive care of viral illness Monitor blood sugars when off steroids  Discharge Diagnosis:   Principal Problem:   Asthma exacerbation Active Problems:   Hypokalemia   Conjunctivitis-right eye   Sepsis [facility])   Discharge disposition:  Home  Discharge Condition: Improved.  Diet recommendation: Regular.  Wound care: None.   History of Present Illness:   Maurice Koch is a 31 y.o. male with medical history significant of asthma, depression, who presents with shortness breath, cough and wheezing.  Patient states that he has been having shortness breath, cough, wheezing for almost a week, which has been progressively getting worse. He was seen in ED several times, and was given prednisone prescription 4 days ago, without significant improvement. He coughs up little yellow colored sputum. Patient speaks in full sentence. He states that he has some chest pain which is induced by coughing. No fever or chills. Patient states that he feels clogged in both ear, no ear discharge or pain. No hearing loss. He also has right eye greenish colored discharge and red eye. No eye pain or vision loss. Patient does not have nausea, vomiting, diarrhea, abdominal pain, symptoms of UTI or unilateral weakness.     Hospital Course by Problem:   Asthma exacerbation secondary to enterovirus (GI upset/red eyes/URI) -supportive care -steroid taper -continue inhalers -eye gtts for conjunctivitis may be viral but R>L  Hypokalemia -replete     Medical Consultants:    None.   Discharge Exam:   Vitals:   02/11/17 1350 02/11/17 1444  BP: 129/77   Pulse: 96   Resp: 20   Temp: 98.3 F (36.8 C)   SpO2: 97% 100%   Vitals:   02/11/17 0620 02/11/17 0925  02/11/17 1350 02/11/17 1444  BP: 116/78  129/77   Pulse: 83  96   Resp: 18  20   Temp: 98.3 F (36.8 C)  98.3 F (36.8 C)   TempSrc: Oral  Oral   SpO2: 99% 98% 97% 100%  Weight:      Height:        Gen:  NAD   The results of significant diagnostics from this hospitalization (including imaging, microbiology, ancillary and laboratory) are listed below for reference.     Procedures and Diagnostic Studies:   No results found.   Labs:   Basic Metabolic Panel: Recent Labs  Lab 02/10/17 1944 02/11/17 0037 02/11/17 1450  NA 136  --  138  K 3.0*  --  3.7  CL 102  --  109  CO2 23  --  18*  GLUCOSE 146*  --  196*  BUN 12  --  10  CREATININE 1.12  --  0.99  CALCIUM 8.9  --  9.0  MG  --  2.0  --    GFR Estimated Creatinine Clearance: 107.3 mL/min (by C-G formula based on SCr of 0.99 mg/dL). Liver Function Tests: No results for input(s): AST, ALT, ALKPHOS, BILITOT, PROT, ALBUMIN in the last 168 hours. No results for input(s): LIPASE, AMYLASE in the last 168 hours. No results for input(s): AMMONIA in the last 168 hours. Coagulation profile No results for input(s): INR, PROTIME in the last 168 hours.  CBC: Recent Labs  Lab 02/10/17 1944  WBC 14.3*  NEUTROABS 9.9*  HGB 14.3  HCT 43.6  MCV 80.1  PLT 275   Cardiac Enzymes: No results for input(s): CKTOTAL, CKMB, CKMBINDEX, TROPONINI in the last 168 hours. BNP: Invalid input(s): POCBNP CBG: No results for input(s): GLUCAP in the last 168 hours. D-Dimer No results for input(s): DDIMER in the last 72 hours. Hgb A1c No results for input(s): HGBA1C in the last 72 hours. Lipid Profile No results for input(s): CHOL, HDL, LDLCALC, TRIG, CHOLHDL, LDLDIRECT in the last 72 hours. Thyroid function studies No results for input(s): TSH, T4TOTAL, T3FREE, THYROIDAB in the last 72 hours.  Invalid input(s): FREET3 Anemia work up No results for input(s): VITAMINB12, FOLATE, FERRITIN, TIBC, IRON, RETICCTPCT in the last 72  hours. Microbiology Recent Results (from the past 240 hour(s))  Respiratory Panel by PCR     Status: Abnormal   Collection Time: 02/10/17  9:49 PM  Result Value Ref Range Status   Adenovirus NOT DETECTED NOT DETECTED Final   Coronavirus 229E NOT DETECTED NOT DETECTED Final   Coronavirus HKU1 NOT DETECTED NOT DETECTED Final   Coronavirus NL63 NOT DETECTED NOT DETECTED Final   Coronavirus OC43 NOT DETECTED NOT DETECTED Final   Metapneumovirus NOT DETECTED NOT DETECTED Final   Rhinovirus / Enterovirus DETECTED (A) NOT DETECTED Final   Influenza A NOT DETECTED NOT DETECTED Final   Influenza B NOT DETECTED NOT DETECTED Final   Parainfluenza Virus 1 NOT DETECTED NOT DETECTED Final   Parainfluenza Virus 2 NOT DETECTED NOT DETECTED Final   Parainfluenza Virus 3 NOT DETECTED NOT DETECTED Final   Parainfluenza Virus 4 NOT DETECTED NOT DETECTED Final   Respiratory Syncytial Virus NOT DETECTED NOT DETECTED Final   Bordetella pertussis NOT DETECTED NOT DETECTED Final   Chlamydophila pneumoniae NOT DETECTED NOT DETECTED Final   Mycoplasma pneumoniae NOT DETECTED NOT DETECTED Final     Discharge Instructions:   Discharge Instructions    Diet general   Complete by:  As directed    Discharge instructions   Complete by:  As directed    You have enterovirus which can cause red eyes, flu like symptoms and GI upset.  Be sure to take you inhalers/nebulizers more frequently while you are sick   Increase activity slowly   Complete by:  As directed      Allergies as of 02/11/2017      Reactions   Shellfish Allergy Hives      Medication List    STOP taking these medications   oxyCODONE-acetaminophen 5-325 MG tablet Commonly known as:  PERCOCET/ROXICET   polyethylene glycol powder powder Commonly known as:  MIRALAX     TAKE these medications   albuterol (2.5 MG/3ML) 0.083% nebulizer solution Commonly known as:  PROVENTIL Take 3 mLs (2.5 mg total) by nebulization every 6 (six) hours as  needed for wheezing or shortness of breath.   albuterol 108 (90 Base) MCG/ACT inhaler Commonly known as:  PROVENTIL HFA;VENTOLIN HFA Inhale 1-2 puffs into the lungs every 6 (six) hours as needed for wheezing or shortness of breath.   azithromycin 250 MG tablet Commonly known as:  ZITHROMAX Take 1 tablet (250 mg total) at bedtime by mouth.   benzonatate 100 MG capsule Commonly known as:  TESSALON Take 1 capsule (100 mg total) by mouth every 8 (eight) hours.   ciprofloxacin-hydrocortisone OTIC suspension Commonly known as:  CIPRO HC Place 3 drops into the right ear 2 (two) times daily.   dicyclomine 20 MG tablet Commonly known as:  BENTYL Take 1 tablet (20 mg total) by mouth 2 (two) times  daily as needed for spasms (only if not experiencing constipation).   escitalopram 20 MG tablet Commonly known as:  LEXAPRO Take 20 mg daily by mouth.   ipratropium 0.02 % nebulizer solution Commonly known as:  ATROVENT Take 2.5 mLs (0.5 mg total) every 6 (six) hours by nebulization.   loratadine 10 MG tablet Commonly known as:  CLARITIN Take 10 mg at bedtime by mouth.   montelukast 10 MG tablet Commonly known as:  SINGULAIR Take 10 mg by mouth at bedtime.   predniSONE 10 MG tablet Commonly known as:  DELTASONE 60 mg x 1 day then 50mg  x 1 day then 40 mg x1 day then 30 mg x 1 day then 20mg  x 1 day then 10 mg x 1 day Start taking on:  02/12/2017 What changed:    medication strength  how much to take  how to take this  when to take this  additional instructions   tobramycin 0.3 % ophthalmic solution Commonly known as:  TOBREX Place 1 drop every 4 (four) hours into the right eye. For 5 days      Follow-up Information    [facility] Follow up in 1 week(s).   Contact information: [address] [place] [id number] [phone]            Time coordinating discharge: 35 min  Signed:  Lakynn Halvorsen U Masiah Lewing   [facility] 02/11/2017, 5:38  PM

## 2017-02-11 NOTE — Progress Notes (Signed)
CRITICAL VALUE ALERT  Critical Value:  Lactic Acid 2.3  Date & Time Notied:  02/11/17 0630  Provider Notified: X. Blount  Orders Received/Actions taken: No new orders received.

## 2017-02-16 LAB — CULTURE, BLOOD (ROUTINE X 2)
Culture: NO GROWTH
Culture: NO GROWTH
SPECIAL REQUESTS: ADEQUATE
Special Requests: ADEQUATE

## 2017-06-02 ENCOUNTER — Emergency Department (HOSPITAL_COMMUNITY): Payer: Medicaid Other

## 2017-06-02 ENCOUNTER — Inpatient Hospital Stay (HOSPITAL_COMMUNITY)
Admission: EM | Admit: 2017-06-02 | Discharge: 2017-06-04 | DRG: 872 | Disposition: A | Discharge status: Home / Self Care | Payer: Medicaid Other | Attending: Family Medicine | Admitting: Family Medicine

## 2017-06-02 ENCOUNTER — Encounter (HOSPITAL_COMMUNITY): Payer: Self-pay | Admitting: Emergency Medicine

## 2017-06-02 DIAGNOSIS — J101 Influenza due to other identified influenza virus with other respiratory manifestations: Secondary | ICD-10-CM | POA: Diagnosis present

## 2017-06-02 DIAGNOSIS — Z7951 Long term (current) use of inhaled steroids: Secondary | ICD-10-CM

## 2017-06-02 DIAGNOSIS — Z825 Family history of asthma and other chronic lower respiratory diseases: Secondary | ICD-10-CM

## 2017-06-02 DIAGNOSIS — Z91013 Allergy to seafood: Secondary | ICD-10-CM

## 2017-06-02 DIAGNOSIS — F419 Anxiety disorder, unspecified: Secondary | ICD-10-CM | POA: Diagnosis present

## 2017-06-02 DIAGNOSIS — J45901 Unspecified asthma with (acute) exacerbation: Secondary | ICD-10-CM | POA: Diagnosis present

## 2017-06-02 DIAGNOSIS — Z7952 Long term (current) use of systemic steroids: Secondary | ICD-10-CM

## 2017-06-02 DIAGNOSIS — F329 Major depressive disorder, single episode, unspecified: Secondary | ICD-10-CM | POA: Diagnosis present

## 2017-06-02 DIAGNOSIS — R6510 Systemic inflammatory response syndrome (SIRS) of non-infectious origin without acute organ dysfunction: Secondary | ICD-10-CM | POA: Diagnosis present

## 2017-06-02 DIAGNOSIS — J111 Influenza due to unidentified influenza virus with other respiratory manifestations: Secondary | ICD-10-CM

## 2017-06-02 DIAGNOSIS — Z79899 Other long term (current) drug therapy: Secondary | ICD-10-CM

## 2017-06-02 DIAGNOSIS — Z833 Family history of diabetes mellitus: Secondary | ICD-10-CM

## 2017-06-02 DIAGNOSIS — R402412 Glasgow coma scale score 13-15, at arrival to emergency department: Secondary | ICD-10-CM | POA: Diagnosis present

## 2017-06-02 DIAGNOSIS — A419 Sepsis, unspecified organism: Secondary | ICD-10-CM | POA: Diagnosis present

## 2017-06-02 DIAGNOSIS — J45909 Unspecified asthma, uncomplicated: Secondary | ICD-10-CM | POA: Diagnosis present

## 2017-06-02 DIAGNOSIS — R69 Illness, unspecified: Secondary | ICD-10-CM

## 2017-06-02 DIAGNOSIS — A4189 Other specified sepsis: Principal | ICD-10-CM | POA: Diagnosis present

## 2017-06-02 LAB — CBC WITH DIFFERENTIAL/PLATELET
BASOS PCT: 0 %
Basophils Absolute: 0 10*3/uL (ref 0.0–0.1)
EOS PCT: 4 %
Eosinophils Absolute: 0.4 10*3/uL (ref 0.0–0.7)
HCT: 49.3 % (ref 39.0–52.0)
Hemoglobin: 16 g/dL (ref 13.0–17.0)
Lymphocytes Relative: 12 %
Lymphs Abs: 1.3 10*3/uL (ref 0.7–4.0)
MCH: 26.5 pg (ref 26.0–34.0)
MCHC: 32.5 g/dL (ref 30.0–36.0)
MCV: 81.8 fL (ref 78.0–100.0)
MONO ABS: 1.1 10*3/uL — AB (ref 0.1–1.0)
Monocytes Relative: 10 %
NEUTROS PCT: 74 %
Neutro Abs: 8.1 10*3/uL — ABNORMAL HIGH (ref 1.7–7.7)
PLATELETS: 222 10*3/uL (ref 150–400)
RBC: 6.03 MIL/uL — ABNORMAL HIGH (ref 4.22–5.81)
RDW: 14.8 % (ref 11.5–15.5)
WBC: 10.9 10*3/uL — ABNORMAL HIGH (ref 4.0–10.5)

## 2017-06-02 LAB — URINALYSIS, ROUTINE W REFLEX MICROSCOPIC
BILIRUBIN URINE: NEGATIVE
Glucose, UA: NEGATIVE mg/dL
Hgb urine dipstick: NEGATIVE
Ketones, ur: 5 mg/dL — AB
LEUKOCYTES UA: NEGATIVE
NITRITE: NEGATIVE
PH: 6 (ref 5.0–8.0)
Protein, ur: NEGATIVE mg/dL
SPECIFIC GRAVITY, URINE: 1.028 (ref 1.005–1.030)

## 2017-06-02 LAB — COMPREHENSIVE METABOLIC PANEL
ALBUMIN: 3.3 g/dL — AB (ref 3.5–5.0)
ALK PHOS: 54 U/L (ref 38–126)
ALT: 25 U/L (ref 17–63)
ANION GAP: 10 (ref 5–15)
AST: 33 U/L (ref 15–41)
BUN: 12 mg/dL (ref 6–20)
CALCIUM: 8.3 mg/dL — AB (ref 8.9–10.3)
CO2: 23 mmol/L (ref 22–32)
CREATININE: 1.23 mg/dL (ref 0.61–1.24)
Chloride: 108 mmol/L (ref 101–111)
GFR calc Af Amer: >60 mL/min (ref >60)
GFR calc non Af Amer: >60 mL/min (ref >60)
GLUCOSE: 81 mg/dL (ref 65–99)
Potassium: 3.7 mmol/L (ref 3.5–5.1)
SODIUM: 141 mmol/L (ref 135–145)
Total Bilirubin: 0.4 mg/dL (ref 0.3–1.2)
Total Protein: 5.6 g/dL — ABNORMAL LOW (ref 6.5–8.1)

## 2017-06-02 LAB — I-STAT CG4 LACTIC ACID, ED
Lactic Acid, Venous: 2.49 mmol/L (ref 0.5–1.9)
Lactic Acid, Venous: 3.1 mmol/L (ref 0.5–1.9)

## 2017-06-02 LAB — PROTIME-INR
INR: 1.02
Prothrombin Time: 13.3 seconds (ref 11.4–15.2)

## 2017-06-02 MED ORDER — LORATADINE 10 MG PO TABS
10.0000 mg | ORAL_TABLET | Freq: Every day | ORAL | Status: DC
  Administered 2017-06-03 (×2): 10 mg via ORAL
  Filled 2017-06-02 (×2): qty 1

## 2017-06-02 MED ORDER — ESCITALOPRAM OXALATE 20 MG PO TABS
20.0000 mg | ORAL_TABLET | Freq: Every day | ORAL | Status: DC
  Administered 2017-06-03 (×2): 20 mg via ORAL
  Filled 2017-06-02: qty 2
  Filled 2017-06-02: qty 1

## 2017-06-02 MED ORDER — ACETAMINOPHEN 650 MG RE SUPP: 650.0000 mg | Freq: Four times a day (QID) | RECTAL | Status: DC | PRN

## 2017-06-02 MED ORDER — BUDESONIDE 0.25 MG/2ML IN SUSP
0.2500 mg | Freq: Two times a day (BID) | RESPIRATORY_TRACT | Status: DC
  Administered 2017-06-03 – 2017-06-04 (×4): 0.25 mg via RESPIRATORY_TRACT
  Filled 2017-06-02 (×5): qty 2

## 2017-06-02 MED ORDER — ONDANSETRON HCL 4 MG PO TABS: 4.0000 mg | ORAL_TABLET | Freq: Four times a day (QID) | ORAL | Status: DC | PRN

## 2017-06-02 MED ORDER — ALBUTEROL SULFATE (2.5 MG/3ML) 0.083% IN NEBU
2.5000 mg | INHALATION_SOLUTION | RESPIRATORY_TRACT | Status: DC
  Administered 2017-06-03: 2.5 mg via RESPIRATORY_TRACT
  Filled 2017-06-02: qty 3

## 2017-06-02 MED ORDER — ACETAMINOPHEN 325 MG PO TABS
650.0000 mg | ORAL_TABLET | Freq: Four times a day (QID) | ORAL | Status: DC | PRN
  Administered 2017-06-03: 650 mg via ORAL
  Filled 2017-06-02: qty 2

## 2017-06-02 MED ORDER — SODIUM CHLORIDE 0.9 % IV BOLUS (SEPSIS)
2000.0000 mL | Freq: Once | INTRAVENOUS | Status: AC
  Administered 2017-06-02: 2000 mL via INTRAVENOUS

## 2017-06-02 MED ORDER — ALBUTEROL SULFATE (2.5 MG/3ML) 0.083% IN NEBU
5.0000 mg | INHALATION_SOLUTION | Freq: Once | RESPIRATORY_TRACT | Status: AC
  Administered 2017-06-02: 5 mg via RESPIRATORY_TRACT
  Filled 2017-06-02: qty 6

## 2017-06-02 MED ORDER — ACETAMINOPHEN 325 MG PO TABS
650.0000 mg | ORAL_TABLET | Freq: Once | ORAL | Status: AC | PRN
  Administered 2017-06-02: 650 mg via ORAL
  Filled 2017-06-02: qty 2

## 2017-06-02 MED ORDER — ENOXAPARIN SODIUM 40 MG/0.4ML ~~LOC~~ SOLN
40.0000 mg | SUBCUTANEOUS | Status: DC
  Administered 2017-06-03: 40 mg via SUBCUTANEOUS
  Filled 2017-06-02: qty 0.4

## 2017-06-02 MED ORDER — MONTELUKAST SODIUM 10 MG PO TABS
10.0000 mg | ORAL_TABLET | Freq: Every day | ORAL | Status: DC
  Administered 2017-06-03 (×2): 10 mg via ORAL
  Filled 2017-06-02 (×3): qty 1

## 2017-06-02 MED ORDER — ACETAMINOPHEN 325 MG PO TABS
325.0000 mg | ORAL_TABLET | Freq: Once | ORAL | Status: AC
Start: 2017-06-02 — End: 2017-06-02
  Administered 2017-06-02: 325 mg via ORAL
  Filled 2017-06-02: qty 1

## 2017-06-02 MED ORDER — SODIUM CHLORIDE 0.9 % IV BOLUS (SEPSIS)
1000.0000 mL | Freq: Once | INTRAVENOUS | Status: AC
  Administered 2017-06-02: 1000 mL via INTRAVENOUS

## 2017-06-02 MED ORDER — ALBUTEROL SULFATE (2.5 MG/3ML) 0.083% IN NEBU: 2.5000 mg | INHALATION_SOLUTION | RESPIRATORY_TRACT | Status: DC | PRN

## 2017-06-02 MED ORDER — ONDANSETRON HCL 4 MG/2ML IJ SOLN
4.0000 mg | Freq: Four times a day (QID) | INTRAMUSCULAR | Status: DC | PRN
  Administered 2017-06-03 (×2): 4 mg via INTRAVENOUS
  Filled 2017-06-02 (×2): qty 2

## 2017-06-02 NOTE — ED Notes (Signed)
I Stat Lactic Acid [facility] to Dr.

## 2017-06-02 NOTE — ED Notes (Signed)
Nurse will draw labs. 

## 2017-06-02 NOTE — ED Notes (Addendum)
I Stat Lactic Acid results of 2.49 shown to Liberty MutualJacubowitz

## 2017-06-02 NOTE — H&P (Addendum)
History and Physical    Maurice Koch ZOX:[id number] DOB: 22-Jun-1985 DOA: 06/02/2017  PCP: [facility], [facility]   Patient coming from: Home.  Chief Complaint: Cough and fever chills.  HPI: Maurice Koch is a 32 y.o. male with history of asthma presents to the ER with complaints of fever chills and cough.  Patient has been having these symptoms for last 2 days.  Patient also has been having generalized body ache.  Family members has been having flu.  Cough is nonproductive.  Has been a pleuritic type of chest pain.  ED Course: In the ER patient is found to have a fever of 102 F tachycardic initially was hypotensive with elevated lactate level.  Blood pressure is improving with fluids but lactate is still increasing.  Patient is being admitted for further management of SIRS likely from flulike symptoms.  Chest x-ray shows bronchitis changes.  Blood cultures have been obtained.  Review of Systems: As per HPI, rest all negative.   Past Medical History:  Diagnosis Date  . Anxiety   . Asthma   . Depression     Past Surgical History:  Procedure Laterality Date  . APPENDECTOMY  2003     reports that  has never smoked. he has never used smokeless tobacco. He reports that he does not drink alcohol or use drugs.  Allergies  Allergen Reactions  . Shellfish Allergy Hives    Family History  Problem Relation Age of Onset  . Diabetes Other   . Asthma Other     Prior to Admission medications   Medication Sig Start Date End Date Taking? Authorizing Provider  albuterol (PROVENTIL HFA;VENTOLIN HFA) 108 (90 Base) MCG/ACT inhaler Inhale 1-2 puffs into the lungs every 6 (six) hours as needed for wheezing or shortness of breath. 02/08/17  Yes Wynetta Fines, MD  albuterol (PROVENTIL) (2.5 MG/3ML) 0.083% nebulizer solution Take 3 mLs (2.5 mg total) by nebulization every 6 (six) hours as needed for wheezing or shortness of breath. 08/17/14  Yes Everlene Farrier, PA-C    budesonide (PULMICORT) 0.25 MG/2ML nebulizer solution Take 0.25 mg by nebulization 3 (three) times daily.   Yes [provider]  escitalopram (LEXAPRO) 20 MG tablet Take 20 mg by mouth at bedtime.  01/30/17  Yes [provider]  ipratropium (ATROVENT) 0.02 % nebulizer solution Take 2.5 mLs (0.5 mg total) every 6 (six) hours by nebulization. Patient taking differently: Take 0.5 mg by nebulization every 6 (six) hours as needed for wheezing or shortness of breath.  02/11/17  Yes Vann, Jessica U, DO  loratadine (CLARITIN) 10 MG tablet Take 10 mg at bedtime by mouth.  01/30/17  Yes [provider]  montelukast (SINGULAIR) 10 MG tablet Take 10 mg by mouth at bedtime.   Yes [provider]  benzonatate (TESSALON) 100 MG capsule Take 1 capsule (100 mg total) by mouth every 8 (eight) hours. Patient not taking: Reported on 06/02/2017 02/06/17   Hedges, Tinnie Gens, PA-C  dicyclomine (BENTYL) 20 MG tablet Take 1 tablet (20 mg total) by mouth 2 (two) times daily as needed for spasms (only if not experiencing constipation). Patient not taking: Reported on 02/08/2017 02/07/15   Gavin Pound, MD    Physical Exam: Vitals:   06/02/17 2200 06/02/17 2206 06/02/17 2219 06/02/17 2315  BP: 99/68 (!) 93/46 (!) 80/50 (!) 101/53  Pulse: (!) 120 (!) 123  (!) 109  Resp: (!) 23 (!) 28  (!) 26  Temp:  (!) 100.9 F (  38.3 C)    TempSrc:  Oral    SpO2: 97% 98%  98%  Weight:      Height:          Constitutional: Moderately built and nourished. Vitals:   06/02/17 2200 06/02/17 2206 06/02/17 2219 06/02/17 2315  BP: 99/68 (!) 93/46 (!) 80/50 (!) 101/53  Pulse: (!) 120 (!) 123  (!) 109  Resp: (!) 23 (!) 28  (!) 26  Temp:  (!) 100.9 F (38.3 C)    TempSrc:  Oral    SpO2: 97% 98%  98%  Weight:      Height:       Eyes: Anicteric no pallor. ENMT: No discharge from the ears eyes nose or mouth. Neck: No JVD appreciated no mass felt. Respiratory: Mild expiratory wheeze no  crepitations. Cardiovascular: S1-S2 heard no murmurs appreciated. Abdomen: Soft nontender bowel sounds present. Musculoskeletal: No edema.  No joint effusion. Skin: No rash.  Skin appears warm. Neurologic: Alert awake oriented to time place and person.  Moves all extremitIES. Psychiatric: Appears normal.  Normal affect.   Labs on Admission: I have personally reviewed following labs and imaging studies  CBC: Recent Labs  Lab 06/02/17 2020  WBC 10.9*  NEUTROABS 8.1*  HGB 16.0  HCT 49.3  MCV 81.8  PLT 222   Basic Metabolic Panel: Recent Labs  Lab 06/02/17 2020  NA 141  K 3.7  CL 108  CO2 23  GLUCOSE 81  BUN 12  CREATININE 1.23  CALCIUM 8.3*   GFR: Estimated Creatinine Clearance: 89.6 mL/min (by C-G formula based on SCr of 1.23 mg/dL). Liver Function Tests: Recent Labs  Lab 06/02/17 2020  AST 33  ALT 25  ALKPHOS 54  BILITOT 0.4  PROT 5.6*  ALBUMIN 3.3*   No results for input(s): LIPASE, AMYLASE in the last 168 hours. No results for input(s): AMMONIA in the last 168 hours. Coagulation Profile: Recent Labs  Lab 06/02/17 2020  INR 1.02   Cardiac Enzymes: No results for input(s): CKTOTAL, CKMB, CKMBINDEX, TROPONINI in the last 168 hours. BNP (last 3 results) No results for input(s): PROBNP in the last 8760 hours. HbA1C: No results for input(s): HGBA1C in the last 72 hours. CBG: No results for input(s): GLUCAP in the last 168 hours. Lipid Profile: No results for input(s): CHOL, HDL, LDLCALC, TRIG, CHOLHDL, LDLDIRECT in the last 72 hours. Thyroid Function Tests: No results for input(s): TSH, T4TOTAL, FREET4, T3FREE, THYROIDAB in the last 72 hours. Anemia Panel: No results for input(s): VITAMINB12, FOLATE, FERRITIN, TIBC, IRON, RETICCTPCT in the last 72 hours. Urine analysis:    Component Value Date/Time   COLORURINE YELLOW 06/02/2017 2210   APPEARANCEUR CLEAR 06/02/2017 2210   LABSPEC 1.028 06/02/2017 2210   PHURINE 6.0 06/02/2017 2210   GLUCOSEU  NEGATIVE 06/02/2017 2210   HGBUR NEGATIVE 06/02/2017 2210   BILIRUBINUR NEGATIVE 06/02/2017 2210   KETONESUR 5 (A) 06/02/2017 2210   PROTEINUR NEGATIVE 06/02/2017 2210   UROBILINOGEN 1.0 02/07/2015 1020   NITRITE NEGATIVE 06/02/2017 2210   LEUKOCYTESUR NEGATIVE 06/02/2017 2210   Sepsis Labs: @LABRCNTIP (procalcitonin:4,lacticidven:4) )No results found for this or any previous visit (from the past 240 hour(s)).   Radiological Exams on Admission: Dg Chest 2 View  Result Date: 06/02/2017 CLINICAL DATA:  Shortness of breath and body aches. Fever and chills. EXAM: CHEST  2 VIEW COMPARISON:  Chest radiograph 02/18/2017 FINDINGS: Increased bilateral interstitial opacities. No focal consolidation. No pneumothorax or pleural effusion. Normal cardiomediastinal contours. IMPRESSION: Increased bilateral interstitial opacities  may indicate acute bronchitis. No focal consolidation. Electronically Signed   By: Deatra Robinson M.D.   On: 06/02/2017 19:15    EKG: Independently reviewed.  Sinus tachycardia.  Assessment/Plan Principal Problem:   SIRS (systemic inflammatory response syndrome) (HCC) Active Problems:   Asthma    1. SIRS likely from flulike symptoms -will start patient on Tamiflu empirically along with Levaquin.  Follow influenza PCR blood cultures urine for Legionella strep antigen.  Continue with hydration.  Follow lactate levels and pro calcitonin levels. 2. Asthma with mild exacerbation patient will be on albuterol nebulizer along with Pulmicort.  Addendum -since patient's flu panel turned to be positive I did not start Levaquin.   DVT prophylaxis: Lovenox. Code Status: Full code. Family Communication: Discussed with patient. Disposition Plan: Home. Consults called: None. Admission status: Observation.   Eduard Clos MD [facility] Pager [phone].  If 7PM-7AM, please contact night-coverage [email or web address] Password [facility]  06/02/2017, 11:36 PM

## 2017-06-02 NOTE — ED Provider Notes (Signed)
[facility] EMERGENCY DEPARTMENT Provider Note   CSN: [id number] Arrival date & time: 06/02/17  1748     History   Chief Complaint Chief Complaint  Patient presents with  . Chest Pain  . Asthma    HPI Maurice Koch is a 32 y.o. male.  Developed cough chest pain and shortness of breath onset this afternoon.  Chest pain is anterior and worse with coughing.  Nothing makes symptoms better other associated symptoms include headache and diffuse myalgias.  No nausea or vomiting.  He treated himself with his albuterol, without relief.  No other associated symptoms.  HPI  Past Medical History:  Diagnosis Date  . Anxiety   . Asthma   . Depression     Patient Active Problem List   Diagnosis Date Noted  . Asthma exacerbation 02/10/2017  . Hypokalemia 02/10/2017  . Conjunctivitis-right eye 02/10/2017  . Sepsis (HCC) 02/10/2017    Past Surgical History:  Procedure Laterality Date  . APPENDECTOMY  2003       Home Medications    Prior to Admission medications   Medication Sig Start Date End Date Taking? Authorizing Provider  albuterol (PROVENTIL HFA;VENTOLIN HFA) 108 (90 Base) MCG/ACT inhaler Inhale 1-2 puffs into the lungs every 6 (six) hours as needed for wheezing or shortness of breath. 02/08/17   Wynetta Fines, MD  albuterol (PROVENTIL) (2.5 MG/3ML) 0.083% nebulizer solution Take 3 mLs (2.5 mg total) by nebulization every 6 (six) hours as needed for wheezing or shortness of breath. 08/17/14   Everlene Farrier, PA-C  azithromycin (ZITHROMAX) 250 MG tablet Take 1 tablet (250 mg total) at bedtime by mouth. 02/11/17   Joseph Art, DO  benzonatate (TESSALON) 100 MG capsule Take 1 capsule (100 mg total) by mouth every 8 (eight) hours. 02/06/17   Hedges, Tinnie Gens, PA-C  dicyclomine (BENTYL) 20 MG tablet Take 1 tablet (20 mg total) by mouth 2 (two) times daily as needed for spasms (only if not experiencing constipation). Patient not taking: Reported on  02/08/2017 02/07/15   Gavin Pound, MD  escitalopram (LEXAPRO) 20 MG tablet Take 20 mg daily by mouth. 01/30/17   [provider]  ipratropium (ATROVENT) 0.02 % nebulizer solution Take 2.5 mLs (0.5 mg total) every 6 (six) hours by nebulization. 02/11/17   Joseph Art, DO  loratadine (CLARITIN) 10 MG tablet Take 10 mg at bedtime by mouth.  01/30/17   [provider]  montelukast (SINGULAIR) 10 MG tablet Take 10 mg by mouth at bedtime.    [provider]  predniSONE (DELTASONE) 10 MG tablet 60 mg x 1 day then 50mg  x 1 day then 40 mg x1 day then 30 mg x 1 day then 20mg  x 1 day then 10 mg x 1 day 02/12/17   Joseph Art, DO  tobramycin (TOBREX) 0.3 % ophthalmic solution Place 1 drop every 4 (four) hours into the right eye. For 5 days 02/11/17   Joseph Art, DO    Family History Family History  Problem Relation Age of Onset  . Diabetes Other   . Asthma Other     Social History Social History   Tobacco Use  . Smoking status: Never Smoker  . Smokeless tobacco: Never Used  Substance Use Topics  . Alcohol use: No  . Drug use: No     Allergies   Shellfish allergy   Review of Systems Review of Systems  Constitutional: Negative.   HENT: Negative.   Respiratory: Positive for  cough and shortness of breath.   Cardiovascular: Positive for chest pain.  Gastrointestinal: Negative.   Musculoskeletal: Positive for myalgias.  Skin: Negative.   Neurological: Positive for headaches.  Psychiatric/Behavioral: Negative.   All other systems reviewed and are negative.    Physical Exam Updated Vital Signs BP (!) 111/92   Pulse (!) 113   Temp (!) 102.2 F (39 C) (Oral)   Resp 16   Ht 5\' 6"  (1.676 m)   Wt 88 kg (194 lb)   SpO2 98%   BMI 31.31 kg/m   Physical Exam  Constitutional: He is oriented to person, place, and time. He appears well-developed and well-nourished.  HENT:  Head: Normocephalic and atraumatic.  Eyes: Conjunctivae are normal.  Pupils are equal, round, and reactive to light.  Neck: Neck supple. No tracheal deviation present. No thyromegaly present.  Cardiovascular: Regular rhythm.  No murmur heard. Mildly tachycardic  Pulmonary/Chest: Effort normal and breath sounds normal.  Abdominal: Soft. Bowel sounds are normal. He exhibits no distension. There is no tenderness.  Musculoskeletal: Normal range of motion. He exhibits no edema or tenderness.  Neurological: He is alert and oriented to person, place, and time. Coordination normal.  Skin: Skin is warm and dry. No rash noted.  Psychiatric: He has a normal mood and affect.  Nursing note and vitals reviewed.    ED Treatments / Results  Labs (all labs ordered are listed, but only abnormal results are displayed) Labs Reviewed  I-STAT CG4 LACTIC ACID, ED - Abnormal; Notable for the following components:      Result Value   Lactic Acid, Venous 2.49 (*)    All other components within normal limits  CULTURE, BLOOD (ROUTINE X 2)  CULTURE, BLOOD (ROUTINE X 2)  PROTIME-INR  COMPREHENSIVE METABOLIC PANEL  CBC WITH DIFFERENTIAL/PLATELET  URINALYSIS, ROUTINE W REFLEX MICROSCOPIC   Results for orders placed or performed during the hospital encounter of 06/02/17  Comprehensive metabolic panel  Result Value Ref Range   Sodium 141 135 - 145 mmol/L   Potassium 3.7 3.5 - 5.1 mmol/L   Chloride 108 101 - 111 mmol/L   CO2 23 22 - 32 mmol/L   Glucose, Bld 81 65 - 99 mg/dL   BUN 12 6 - 20 mg/dL   Creatinine, Ser 1.611.23 0.61 - 1.24 mg/dL   Calcium 8.3 (L) 8.9 - 10.3 mg/dL   Total Protein 5.6 (L) 6.5 - 8.1 g/dL   Albumin 3.3 (L) 3.5 - 5.0 g/dL   AST 33 15 - 41 U/L   ALT 25 17 - 63 U/L   Alkaline Phosphatase 54 38 - 126 U/L   Total Bilirubin 0.4 0.3 - 1.2 mg/dL   GFR calc non Af Amer >60 >60 mL/min   GFR calc Af Amer >60 >60 mL/min   Anion gap 10 5 - 15  CBC with Differential  Result Value Ref Range   WBC 10.9 (H) 4.0 - 10.5 K/uL   RBC 6.03 (H) 4.22 - 5.81 MIL/uL    Hemoglobin 16.0 13.0 - 17.0 g/dL   HCT 09.649.3 04.539.0 - 40.952.0 %   MCV 81.8 78.0 - 100.0 fL   MCH 26.5 26.0 - 34.0 pg   MCHC 32.5 30.0 - 36.0 g/dL   RDW 81.114.8 91.411.5 - 78.215.5 %   Platelets 222 150 - 400 K/uL   Neutrophils Relative % 74 %   Lymphocytes Relative 12 %   Monocytes Relative 10 %   Eosinophils Relative 4 %   Basophils Relative 0 %  Neutro Abs 8.1 (H) 1.7 - 7.7 K/uL   Lymphs Abs 1.3 0.7 - 4.0 K/uL   Monocytes Absolute 1.1 (H) 0.1 - 1.0 K/uL   Eosinophils Absolute 0.4 0.0 - 0.7 K/uL   Basophils Absolute 0.0 0.0 - 0.1 K/uL   Smear Review MORPHOLOGY UNREMARKABLE   Protime-INR  Result Value Ref Range   Prothrombin Time 13.3 11.4 - 15.2 seconds   INR 1.02   Urinalysis, Routine w reflex microscopic  Result Value Ref Range   Color, Urine YELLOW YELLOW   APPearance CLEAR CLEAR   Specific Gravity, Urine 1.028 1.005 - 1.030   pH 6.0 5.0 - 8.0   Glucose, UA NEGATIVE NEGATIVE mg/dL   Hgb urine dipstick NEGATIVE NEGATIVE   Bilirubin Urine NEGATIVE NEGATIVE   Ketones, ur 5 (A) NEGATIVE mg/dL   Protein, ur NEGATIVE NEGATIVE mg/dL   Nitrite NEGATIVE NEGATIVE   Leukocytes, UA NEGATIVE NEGATIVE  I-Stat CG4 Lactic Acid, ED  Result Value Ref Range   Lactic Acid, Venous 2.49 (HH) 0.5 - 1.9 mmol/L  I-Stat CG4 Lactic Acid, ED  Result Value Ref Range   Lactic Acid, Venous 3.10 (HH) 0.5 - 1.9 mmol/L   Comment MD NOTIFIED, SUGGEST RECOLLECT    Dg Chest 2 View  Result Date: 06/02/2017 CLINICAL DATA:  Shortness of breath and body aches. Fever and chills. EXAM: CHEST  2 VIEW COMPARISON:  Chest radiograph 02/18/2017 FINDINGS: Increased bilateral interstitial opacities. No focal consolidation. No pneumothorax or pleural effusion. Normal cardiomediastinal contours. IMPRESSION: Increased bilateral interstitial opacities may indicate acute bronchitis. No focal consolidation. Electronically Signed   By: Deatra Robinson M.D.   On: 06/02/2017 19:15    EKG  EKG Interpretation  Date/Time:  Sunday June 02 2017 17:56:32 EST Ventricular Rate:  134 PR Interval:  154 QRS Duration: 82 QT Interval:  284 QTC Calculation: 424 R Axis:   75 Text Interpretation:  Sinus tachycardia Otherwise normal ECG SINCE LAST TRACING HEART RATE HAS INCREASED Confirmed by Doug Sou [phone]) on 06/02/2017 9:07:43 PM       Radiology Dg Chest 2 View  Result Date: 06/02/2017 CLINICAL DATA:  Shortness of breath and body aches. Fever and chills. EXAM: CHEST  2 VIEW COMPARISON:  Chest radiograph 02/18/2017 FINDINGS: Increased bilateral interstitial opacities. No focal consolidation. No pneumothorax or pleural effusion. Normal cardiomediastinal contours. IMPRESSION: Increased bilateral interstitial opacities may indicate acute bronchitis. No focal consolidation. Electronically Signed   By: Deatra Robinson M.D.   On: 06/02/2017 19:15    Procedures Procedures (including critical care time)  Medications Ordered in ED Medications  sodium chloride 0.9 % bolus 2,000 mL (not administered)  acetaminophen (TYLENOL) tablet 325 mg (not administered)  albuterol (PROVENTIL) (2.5 MG/3ML) 0.083% nebulizer solution 5 mg (not administered)  acetaminophen (TYLENOL) tablet 650 mg (650 mg Oral Given 06/02/17 2031)   Chest x-ray viewed by me  Initial Impression / Assessment and Plan / ED Course  I have reviewed the triage vital signs and the nursing notes.  Pertinent labs & imaging results that were available during my care of the patient were reviewed by me and considered in my medical decision making (see chart for details).    20 2:25 PM after treatment with albuterol and 2 L of normal saline intravenously he remains hypotensive and tachycardic.  Additional  IV normal saline bolus ordered.  Patient states his breathing is somewhat improved after albuterol.  Patient is alert Glasgow Coma Score 15 appears mildly uncomfortable.  Lungs with scant rhonchi. I have  consulted Dr.Kakrakandy will arrange for overnight stay Final  Clinical Impressions(s) / ED Diagnoses  Diagnosis #1 influenza-like illness #2 hypotension Final diagnoses:  None    ED Discharge Orders    None       Doug Sou, MD 06/02/17 2329

## 2017-06-02 NOTE — ED Triage Notes (Signed)
Pt reports coughing, fever, chills x 2 days, states he has a hx of asthma and his nebulizer/inhaler do not give him relief. Pt states he used his own nebulizer at home PTA today. He reports that he now has subsequent chest pain and headache from the cough.

## 2017-06-02 NOTE — ED Notes (Signed)
First liter of fluid began infusing at 2045

## 2017-06-03 DIAGNOSIS — J101 Influenza due to other identified influenza virus with other respiratory manifestations: Secondary | ICD-10-CM | POA: Diagnosis not present

## 2017-06-03 DIAGNOSIS — F419 Anxiety disorder, unspecified: Secondary | ICD-10-CM | POA: Diagnosis present

## 2017-06-03 DIAGNOSIS — Z7952 Long term (current) use of systemic steroids: Secondary | ICD-10-CM | POA: Diagnosis not present

## 2017-06-03 DIAGNOSIS — Z833 Family history of diabetes mellitus: Secondary | ICD-10-CM | POA: Diagnosis not present

## 2017-06-03 DIAGNOSIS — J45901 Unspecified asthma with (acute) exacerbation: Secondary | ICD-10-CM

## 2017-06-03 DIAGNOSIS — Z825 Family history of asthma and other chronic lower respiratory diseases: Secondary | ICD-10-CM | POA: Diagnosis not present

## 2017-06-03 DIAGNOSIS — A419 Sepsis, unspecified organism: Secondary | ICD-10-CM | POA: Diagnosis not present

## 2017-06-03 DIAGNOSIS — R69 Illness, unspecified: Secondary | ICD-10-CM | POA: Diagnosis present

## 2017-06-03 DIAGNOSIS — A4189 Other specified sepsis: Secondary | ICD-10-CM | POA: Diagnosis not present

## 2017-06-03 DIAGNOSIS — Z7951 Long term (current) use of inhaled steroids: Secondary | ICD-10-CM | POA: Diagnosis not present

## 2017-06-03 DIAGNOSIS — Z79899 Other long term (current) drug therapy: Secondary | ICD-10-CM | POA: Diagnosis not present

## 2017-06-03 DIAGNOSIS — F329 Major depressive disorder, single episode, unspecified: Secondary | ICD-10-CM | POA: Diagnosis present

## 2017-06-03 DIAGNOSIS — Z91013 Allergy to seafood: Secondary | ICD-10-CM | POA: Diagnosis not present

## 2017-06-03 DIAGNOSIS — R402412 Glasgow coma scale score 13-15, at arrival to emergency department: Secondary | ICD-10-CM | POA: Diagnosis present

## 2017-06-03 LAB — COMPREHENSIVE METABOLIC PANEL
ALT: 21 U/L (ref 17–63)
ANION GAP: 8 (ref 5–15)
AST: 20 U/L (ref 15–41)
Albumin: 2.8 g/dL — ABNORMAL LOW (ref 3.5–5.0)
Alkaline Phosphatase: 44 U/L (ref 38–126)
BUN: 11 mg/dL (ref 6–20)
CHLORIDE: 111 mmol/L (ref 101–111)
CO2: 20 mmol/L — AB (ref 22–32)
Calcium: 7.4 mg/dL — ABNORMAL LOW (ref 8.9–10.3)
Creatinine, Ser: 0.98 mg/dL (ref 0.61–1.24)
Glucose, Bld: 140 mg/dL — ABNORMAL HIGH (ref 65–99)
POTASSIUM: 3.1 mmol/L — AB (ref 3.5–5.1)
SODIUM: 139 mmol/L (ref 135–145)
Total Bilirubin: 0.4 mg/dL (ref 0.3–1.2)
Total Protein: 4.6 g/dL — ABNORMAL LOW (ref 6.5–8.1)

## 2017-06-03 LAB — CBC WITH DIFFERENTIAL/PLATELET
BASOS ABS: 0 10*3/uL (ref 0.0–0.1)
BASOS PCT: 0 %
Eosinophils Absolute: 0.4 10*3/uL (ref 0.0–0.7)
Eosinophils Relative: 4 %
HEMATOCRIT: 42.3 % (ref 39.0–52.0)
HEMOGLOBIN: 13.4 g/dL (ref 13.0–17.0)
Lymphocytes Relative: 20 %
Lymphs Abs: 1.9 10*3/uL (ref 0.7–4.0)
MCH: 25.7 pg — ABNORMAL LOW (ref 26.0–34.0)
MCHC: 31.7 g/dL (ref 30.0–36.0)
MCV: 81.2 fL (ref 78.0–100.0)
MONOS PCT: 8 %
Monocytes Absolute: 0.8 10*3/uL (ref 0.1–1.0)
NEUTROS ABS: 6.3 10*3/uL (ref 1.7–7.7)
Neutrophils Relative %: 68 %
Platelets: 182 10*3/uL (ref 150–400)
RBC: 5.21 MIL/uL (ref 4.22–5.81)
RDW: 14.5 % (ref 11.5–15.5)
WBC: 9.4 10*3/uL (ref 4.0–10.5)

## 2017-06-03 LAB — CREATININE, SERUM
CREATININE: 1.11 mg/dL (ref 0.61–1.24)
GFR calc Af Amer: >60 mL/min (ref >60)

## 2017-06-03 LAB — CBC
HCT: 40.4 % (ref 39.0–52.0)
HEMOGLOBIN: 12.9 g/dL — AB (ref 13.0–17.0)
MCH: 26.1 pg (ref 26.0–34.0)
MCHC: 31.9 g/dL (ref 30.0–36.0)
MCV: 81.6 fL (ref 78.0–100.0)
PLATELETS: 194 10*3/uL (ref 150–400)
RBC: 4.95 MIL/uL (ref 4.22–5.81)
RDW: 14.5 % (ref 11.5–15.5)
WBC: 9.3 10*3/uL (ref 4.0–10.5)

## 2017-06-03 LAB — PROCALCITONIN

## 2017-06-03 LAB — INFLUENZA PANEL BY PCR (TYPE A & B)
INFLBPCR: NEGATIVE
Influenza A By PCR: POSITIVE — AB

## 2017-06-03 LAB — LACTIC ACID, PLASMA
LACTIC ACID, VENOUS: 1.7 mmol/L (ref 0.5–1.9)
LACTIC ACID, VENOUS: 2.1 mmol/L — AB (ref 0.5–1.9)

## 2017-06-03 LAB — TROPONIN I: Troponin I: <0.03 ng/mL (ref <0.03)

## 2017-06-03 MED ORDER — METHYLPREDNISOLONE SODIUM SUCC 40 MG IJ SOLR
40.0000 mg | Freq: Two times a day (BID) | INTRAMUSCULAR | Status: DC
  Administered 2017-06-03 – 2017-06-04 (×3): 40 mg via INTRAVENOUS
  Filled 2017-06-03 (×3): qty 1

## 2017-06-03 MED ORDER — POTASSIUM CHLORIDE CRYS ER 20 MEQ PO TBCR
40.0000 meq | EXTENDED_RELEASE_TABLET | Freq: Two times a day (BID) | ORAL | Status: AC
Start: 2017-06-03 — End: 2017-06-03
  Administered 2017-06-03 (×2): 40 meq via ORAL
  Filled 2017-06-03 (×2): qty 2

## 2017-06-03 MED ORDER — OSELTAMIVIR PHOSPHATE 75 MG PO CAPS
75.0000 mg | ORAL_CAPSULE | Freq: Two times a day (BID) | ORAL | Status: DC
  Administered 2017-06-03 – 2017-06-04 (×3): 75 mg via ORAL
  Filled 2017-06-03 (×4): qty 1

## 2017-06-03 MED ORDER — ALBUTEROL SULFATE (2.5 MG/3ML) 0.083% IN NEBU
2.5000 mg | INHALATION_SOLUTION | Freq: Three times a day (TID) | RESPIRATORY_TRACT | Status: DC
  Administered 2017-06-03 – 2017-06-04 (×4): 2.5 mg via RESPIRATORY_TRACT
  Filled 2017-06-03 (×4): qty 3

## 2017-06-03 NOTE — Progress Notes (Signed)
  PROGRESS NOTE  Maurice Koch KVQ:[id number] DOB: 08-Apr-1986 DOA: 06/02/2017 PCP: [facility]  Brief Narrative: 32 year old man PMH asthma presented with fever, chills, cough, myalgias.  Family members with flu.  Pleuritic chest pain.  Initially febrile and hypotensive.  Admitted for suspected influenza, mild asthma exacerbation.   Assessment/Plan Influenza A, sepsis. - continue Tamiflu, IVF  Acute asthma exacerbation - still SOB, not at baseline. Continue nebs, steroids. Oxygen PRN.  Based on clinical appearance will downgrade to med surg bed  DVT prophylaxis: enoxparin Code Status: full Family Communication:  Disposition Plan: home    Brendia Sacksaniel Shannin Naab, MD  [facility] Direct contact: [phone] --Via amion app OR  --[email or web address]; password [email or web address]  7PM-7AM contact night coverage as above 06/03/2017, 10:50 AM  LOS: 0 days   Consultants:    Procedures:    Antimicrobials:  oseltamivir 2/25 >>  Interval history/Subjective: Feels SOB, poor appetite.  Objective: Vitals:  Vitals:   06/03/17 1000 06/03/17 1017  BP: 130/80   Pulse: (!) 102   Resp:    Temp:    SpO2: 99% 99%    Exam:  Constitutional:  . Appears calm, uncomfortable, non-toxic but ill Respiratory:  . Fair air movement, some wheeze . Respiratory effort mild to moderate increased Cardiovascular:  . RRR, no m/r/g . No LE extremity edema   Psychiatric:  . Mental status o Mood, affect appropriate  I have personally reviewed the following:   Labs:  Potassium 3.1  LFTs unremarkable  Troponin negative  Lactic acid is normalize influenza A positive d  CBC unremarkable  Imaging studies: Chest x-ray possible acute bronchitis  Medical tests:  EKG ST   Scheduled Meds: . albuterol  2.5 mg Nebulization TID  . budesonide (PULMICORT) nebulizer solution  0.25 mg Nebulization BID  . enoxaparin (LOVENOX) injection  40 mg Subcutaneous Q24H  . escitalopram   20 mg Oral QHS  . loratadine  10 mg Oral QHS  . methylPREDNISolone (SOLU-MEDROL) injection  40 mg Intravenous Q12H  . montelukast  10 mg Oral QHS  . oseltamivir  75 mg Oral BID  . potassium chloride  40 mEq Oral BID   Continuous Infusions:  Principal Problem:   Influenza A Active Problems:   Asthma exacerbation   Sepsis (HCC)   LOS: 0 days

## 2017-06-03 NOTE — ED Notes (Signed)
Gave some apple juice patient is resting with family at bedside and call bell in reach

## 2017-06-03 NOTE — ED Notes (Signed)
Regular lunch tray ordered 

## 2017-06-03 NOTE — ED Notes (Signed)
Patient given apple juice to drink.  Dinner tray at bedside.  Patient still has no appetite.

## 2017-06-03 NOTE — ED Notes (Signed)
Pt placed in hospital bed for comfort.

## 2017-06-03 NOTE — ED Notes (Signed)
Patient nauseated and vomiting up tamiflu.

## 2017-06-04 ENCOUNTER — Other Ambulatory Visit: Payer: Self-pay

## 2017-06-04 LAB — LEGIONELLA PNEUMOPHILA SEROGP 1 UR AG: L. PNEUMOPHILA SEROGP 1 UR AG: NEGATIVE

## 2017-06-04 LAB — STREP PNEUMONIAE URINARY ANTIGEN: Strep Pneumo Urinary Antigen: NEGATIVE

## 2017-06-04 MED ORDER — OSELTAMIVIR PHOSPHATE 75 MG PO CAPS: 75.0000 mg | ORAL_CAPSULE | Freq: Two times a day (BID) | ORAL | 0 refills | Status: DC

## 2017-06-04 MED ORDER — ALBUTEROL SULFATE HFA 108 (90 BASE) MCG/ACT IN AERS: 1.0000 | INHALATION_SPRAY | Freq: Four times a day (QID) | RESPIRATORY_TRACT | 0 refills | Status: AC | PRN

## 2017-06-04 MED ORDER — PREDNISONE 20 MG PO TABS: 40.0000 mg | ORAL_TABLET | Freq: Every day | ORAL | 0 refills | Status: DC

## 2017-06-04 NOTE — Progress Notes (Signed)
Discharge instructions reviewed with patient and his sign. Other - no questions by the Pt.  Pt was given his instructions on where to pick up prescriptions - Pt received call from pharmacy that scripts were ready.  Pt to f/u prn with clinic.  Pt was given his discharge instructions.  Pt was taken to front via w/c to meet his wife for home discharge.

## 2017-06-04 NOTE — Discharge Summary (Signed)
Physician Discharge Summary  Maurice Koch ZOX:[id number] DOB: Dec 18, 1985 DOA: 06/02/2017  PCP: [facility]  Admit date: 06/02/2017 Discharge date: 06/04/2017  Recommendations for Outpatient Follow-up:  1. Asthma as needed  Follow-up Information    [facility] Follow up.   Why:  as needed Contact information: [address] [place] [address] [phone]            Discharge Diagnoses:  1. Influenza A 2. Sepsis. 3. Acute asthma exacerbation  Discharge Condition: imprvoed Disposition: home  Diet recommendation: regular  Filed Weights   06/02/17 1800 06/03/17 1902  Weight: 88 kg (194 lb) 88 kg (194 lb)    History of present illness:  32 year old man PMH asthma presented with fever, chills, cough, myalgias.  Family members with flu.  Pleuritic chest pain.  Initially febrile and hypotensive.  Admitted for suspected influenza, mild asthma exacerbation.   Hospital Course:  Patient was treated with Tamiflu, bronchodilators, steroids with rapid clinical improvement.  Hospitalization was uncomplicated.  Influenza A, sepsis. -Much improved.  Continue Tamiflu as an outpatient.  Acute asthma exacerbation -Appears close to baseline now.  Requested a new prescription for an albuterol inhaler which was given.  Will discharge on several days of steroids  Antimicrobials:  oseltamivir 2/25 >>  Today's assessment: S: Feels better, breathing better O: Vitals:  Vitals:   06/04/17 0424 06/04/17 0818  BP: 113/64   Pulse: 67   Resp: 18   Temp: 98.4 F (36.9 C)   SpO2: 93% 95%    Constitutional:  . Appears calm and comfortable Respiratory:  . Few wheezes, fair air movement, improved compared to yesterday . Respiratory effort normal Cardiovascular:  . RRR, no m/r/g . No LE extremity edema   Psychiatric:  . Mental status o Mood, affect appropriate    Discharge Instructions  Discharge Instructions     Activity as tolerated - No restrictions   Complete by:  As directed    Diet general   Complete by:  As directed    Discharge instructions   Complete by:  As directed    Call your physician or seek immediate medical attention for shortness of breath, fever or worsening of condition.     Allergies as of 06/04/2017      Reactions   Shellfish Allergy Hives      Medication List    TAKE these medications   albuterol (2.5 MG/3ML) 0.083% nebulizer solution Commonly known as:  PROVENTIL Take 3 mLs (2.5 mg total) by nebulization every 6 (six) hours as needed for wheezing or shortness of breath.   albuterol 108 (90 Base) MCG/ACT inhaler Commonly known as:  PROVENTIL HFA;VENTOLIN HFA Inhale 1-2 puffs into the lungs every 6 (six) hours as needed for wheezing or shortness of breath.   benzonatate 100 MG capsule Commonly known as:  TESSALON Take 1 capsule (100 mg total) by mouth every 8 (eight) hours.   budesonide 0.25 MG/2ML nebulizer solution Commonly known as:  PULMICORT Take 0.25 mg by nebulization 3 (three) times daily.   dicyclomine 20 MG tablet Commonly known as:  BENTYL Take 1 tablet (20 mg total) by mouth 2 (two) times daily as needed for spasms (only if not experiencing constipation).   escitalopram 20 MG tablet Commonly known as:  LEXAPRO Take 20 mg by mouth at bedtime.   ipratropium 0.02 % nebulizer solution Commonly known as:  ATROVENT Take 2.5 mLs (0.5 mg total) every 6 (six) hours by nebulization. What changed:  when to take this  reasons to take this   loratadine 10 MG tablet Commonly known as:  CLARITIN Take 10 mg at bedtime by mouth.   montelukast 10 MG tablet Commonly known as:  SINGULAIR Take 10 mg by mouth at bedtime.   oseltamivir 75 MG capsule Commonly known as:  TAMIFLU Take 1 capsule (75 mg total) by mouth 2 (two) times daily.   predniSONE 20 MG tablet Commonly known as:  DELTASONE Take 2 tablets (40 mg total) by mouth daily.       Allergies  Allergen Reactions  . Shellfish Allergy Hives    The results of significant diagnostics from this hospitalization (including imaging, microbiology, ancillary and laboratory) are listed below for reference.    Significant Diagnostic Studies: Dg Chest 2 View  Result Date: 06/02/2017 CLINICAL DATA:  Shortness of breath and body aches. Fever and chills. EXAM: CHEST  2 VIEW COMPARISON:  Chest radiograph 02/18/2017 FINDINGS: Increased bilateral interstitial opacities. No focal consolidation. No pneumothorax or pleural effusion. Normal cardiomediastinal contours. IMPRESSION: Increased bilateral interstitial opacities may indicate acute bronchitis. No focal consolidation. Electronically Signed   By: Deatra RobinsonKevin  Herman M.D.   On: 06/02/2017 19:15    Microbiology: Recent Results (from the past 240 hour(s))  Culture, blood (Routine x 2)     Status: None (Preliminary result)   Collection Time: 06/02/17  8:20 PM  Result Value Ref Range Status   Specimen Description BLOOD LEFT ANTECUBITAL  Final   Special Requests   Final    BOTTLES DRAWN AEROBIC AND ANAEROBIC Blood Culture adequate volume   Culture   Final    NO GROWTH 2 DAYS Performed at [facility], [address] [place], [place] [address]    Report Status PENDING  Incomplete  Culture, blood (Routine x 2)     Status: None (Preliminary result)   Collection Time: 06/02/17  8:29 PM  Result Value Ref Range Status   Specimen Description BLOOD LEFT HAND  Final   Special Requests   Final    BOTTLES DRAWN AEROBIC AND ANAEROBIC Blood Culture adequate volume   Culture   Final    NO GROWTH 2 DAYS Performed at [facility], [address] [place], [place] [address]    Report Status PENDING  Incomplete     Labs: Basic Metabolic Panel: Recent Labs  Lab 06/02/17 2020 06/03/17 0020 06/03/17 0220  NA 141  --  139  K 3.7  --  3.1*  CL 108  --  111  CO2 23  --  20*  GLUCOSE 81  --  140*  BUN 12  --  11  CREATININE 1.23  1.11 0.98  CALCIUM 8.3*  --  7.4*   Liver Function Tests: Recent Labs  Lab 06/02/17 2020 06/03/17 0220  AST 33 20  ALT 25 21  ALKPHOS 54 44  BILITOT 0.4 0.4  PROT 5.6* 4.6*  ALBUMIN 3.3* 2.8*   CBC: Recent Labs  Lab 06/02/17 2020 06/03/17 0020 06/03/17 0220  WBC 10.9* 9.3 9.4  NEUTROABS 8.1*  --  6.3  HGB 16.0 12.9* 13.4  HCT 49.3 40.4 42.3  MCV 81.8 81.6 81.2  PLT 222 194 182   Cardiac Enzymes: Recent Labs  Lab 06/03/17 0220  TROPONINI <0.03    Principal Problem:   Influenza A Active Problems:   Asthma exacerbation   Sepsis (HCC)   Time coordinating discharge: 20 minutes  Signed:  Brendia Sacksaniel Emiel Kielty, MD [facility] 06/04/2017, 2:37 PM

## 2017-06-07 LAB — CULTURE, BLOOD (ROUTINE X 2)
CULTURE: NO GROWTH
CULTURE: NO GROWTH
SPECIAL REQUESTS: ADEQUATE
Special Requests: ADEQUATE

## 2017-09-23 ENCOUNTER — Ambulatory Visit: Payer: Medicaid Other | Admitting: Family Medicine

## 2018-01-14 ENCOUNTER — Ambulatory Visit: Payer: Self-pay | Admitting: Allergy and Immunology

## 2018-05-17 ENCOUNTER — Other Ambulatory Visit: Payer: Self-pay

## 2018-05-17 ENCOUNTER — Emergency Department (HOSPITAL_COMMUNITY)
Admission: EM | Admit: 2018-05-17 | Discharge: 2018-05-17 | Disposition: A | Discharge status: Home / Self Care | Payer: Medicaid Other | Attending: Emergency Medicine | Admitting: Emergency Medicine

## 2018-05-17 ENCOUNTER — Encounter (HOSPITAL_COMMUNITY): Payer: Self-pay | Admitting: *Deleted

## 2018-05-17 DIAGNOSIS — Z79899 Other long term (current) drug therapy: Secondary | ICD-10-CM | POA: Diagnosis not present

## 2018-05-17 DIAGNOSIS — Z760 Encounter for issue of repeat prescription: Secondary | ICD-10-CM | POA: Insufficient documentation

## 2018-05-17 DIAGNOSIS — J45909 Unspecified asthma, uncomplicated: Secondary | ICD-10-CM | POA: Insufficient documentation

## 2018-05-17 MED ORDER — ALBUTEROL SULFATE (2.5 MG/3ML) 0.083% IN NEBU: 2.5000 mg | INHALATION_SOLUTION | Freq: Four times a day (QID) | RESPIRATORY_TRACT | 0 refills | Status: AC | PRN

## 2018-05-17 MED ORDER — ALBUTEROL SULFATE HFA 108 (90 BASE) MCG/ACT IN AERS: 1.0000 | INHALATION_SPRAY | RESPIRATORY_TRACT | 0 refills | Status: AC | PRN

## 2018-05-17 MED ORDER — ALBUTEROL SULFATE HFA 108 (90 BASE) MCG/ACT IN AERS
2.0000 | INHALATION_SPRAY | Freq: Once | RESPIRATORY_TRACT | Status: AC
  Administered 2018-05-17: 2 via RESPIRATORY_TRACT
  Filled 2018-05-17: qty 6.7

## 2018-05-17 NOTE — ED Provider Notes (Signed)
[facility]-EMERGENCY DEPT Provider Note   CSN: [id number] Arrival date & time: 05/17/18  2030     History   Chief Complaint Chief Complaint  Patient presents with  . Medication Refill    HPI Maurice Koch is a 33 y.o. male with a PMHx of asthma, who presents to the ED for a medication refill.  Patient states that his PCP did not refill his inhaler and nebulizer the last time he saw them, and he has run out of his albuterol inhaler yesterday and his albuterol nebulizers today.  He used his nebulizer this morning but has not needed it since then.  He came in for medication refill because he has run out.  He denies having any issues or complaints today, denies any fevers, cough, URI symptoms, chest pain, shortness of breath, wheezing, or any other complaints at this time.  He just needs a refill of his inhaler and nebulizer.  The history is provided by the patient and medical records. No language interpreter was used.    Past Medical History:  Diagnosis Date  . Anxiety   . Asthma   . Depression     Patient Active Problem List   Diagnosis Date Noted  . Influenza A 06/03/2017  . Asthma exacerbation 02/10/2017  . Hypokalemia 02/10/2017  . Conjunctivitis-right eye 02/10/2017  . Sepsis (HCC) 02/10/2017    Past Surgical History:  Procedure Laterality Date  . APPENDECTOMY  2003        Home Medications    Prior to Admission medications   Medication Sig Start Date End Date Taking? Authorizing Provider  albuterol (PROVENTIL HFA;VENTOLIN HFA) 108 (90 Base) MCG/ACT inhaler Inhale 1-2 puffs into the lungs every 6 (six) hours as needed for wheezing or shortness of breath. 06/04/17   Standley Brooking, MD  albuterol (PROVENTIL) (2.5 MG/3ML) 0.083% nebulizer solution Take 3 mLs (2.5 mg total) by nebulization every 6 (six) hours as needed for wheezing or shortness of breath. 08/17/14   Everlene Farrier, PA-C  benzonatate (TESSALON) 100 MG capsule Take 1 capsule  (100 mg total) by mouth every 8 (eight) hours. Patient not taking: Reported on 06/02/2017 02/06/17   Hedges, Tinnie Gens, PA-C  budesonide (PULMICORT) 0.25 MG/2ML nebulizer solution Take 0.25 mg by nebulization 3 (three) times daily.    [provider]  dicyclomine (BENTYL) 20 MG tablet Take 1 tablet (20 mg total) by mouth 2 (two) times daily as needed for spasms (only if not experiencing constipation). Patient not taking: Reported on 02/08/2017 02/07/15   Gavin Pound, MD  escitalopram (LEXAPRO) 20 MG tablet Take 20 mg by mouth at bedtime.  01/30/17   [provider]  ipratropium (ATROVENT) 0.02 % nebulizer solution Take 2.5 mLs (0.5 mg total) every 6 (six) hours by nebulization. Patient taking differently: Take 0.5 mg by nebulization every 6 (six) hours as needed for wheezing or shortness of breath.  02/11/17   Joseph Art, DO  loratadine (CLARITIN) 10 MG tablet Take 10 mg at bedtime by mouth.  01/30/17   [provider]  montelukast (SINGULAIR) 10 MG tablet Take 10 mg by mouth at bedtime.    [provider]  oseltamivir (TAMIFLU) 75 MG capsule Take 1 capsule (75 mg total) by mouth 2 (two) times daily. 06/04/17   Standley Brooking, MD  predniSONE (DELTASONE) 20 MG tablet Take 2 tablets (40 mg total) by mouth daily. 06/04/17   Standley Brooking, MD    Family History Family History  Problem  Relation Age of Onset  . Diabetes Other   . Asthma Other     Social History Social History   Tobacco Use  . Smoking status: Never Smoker  . Smokeless tobacco: Never Used  Substance Use Topics  . Alcohol use: No  . Drug use: No     Allergies   Shellfish allergy   Review of Systems Review of Systems  Constitutional: Negative for chills and fever.  HENT: Negative for rhinorrhea.   Respiratory: Negative for cough, shortness of breath and wheezing.   Cardiovascular: Negative for chest pain.  Allergic/Immunologic: Negative for immunocompromised state.      Physical Exam Updated Vital Signs BP (!) 121/91 (BP Location: Left Arm)   Pulse 84   Temp 98.8 F (37.1 C) (Oral)   Resp 16   Ht 5\' 6"  (1.676 m)   Wt 85.6 kg   SpO2 100%   BMI 30.46 kg/m   Physical Exam Vitals signs and nursing note reviewed.  Constitutional:      General: He is not in acute distress.    Appearance: Normal appearance. He is well-developed. He is not toxic-appearing.     Comments: Afebrile, nontoxic, NAD  HENT:     Head: Normocephalic and atraumatic.  Eyes:     General:        Right eye: No discharge.        Left eye: No discharge.     Conjunctiva/sclera: Conjunctivae normal.  Neck:     Musculoskeletal: Normal range of motion and neck supple.  Cardiovascular:     Rate and Rhythm: Normal rate and regular rhythm.     Pulses: Normal pulses.     Heart sounds: Normal heart sounds, S1 normal and S2 normal. No murmur. No friction rub. No gallop.   Pulmonary:     Effort: Pulmonary effort is normal. No respiratory distress.     Breath sounds: No decreased breath sounds, wheezing, rhonchi or rales.     Comments: CTAB in all lung fields, no w/r/r, no hypoxia or increased WOB, speaking in full sentences, SpO2 100% on RA  Abdominal:     General: There is no distension.  Musculoskeletal: Normal range of motion.  Skin:    General: Skin is warm and dry.     Findings: No rash.  Neurological:     Mental Status: He is alert and oriented to person, place, and time.     Sensory: Sensation is intact. No sensory deficit.     Motor: Motor function is intact.  Psychiatric:        Mood and Affect: Mood and affect normal.        Behavior: Behavior normal.      ED Treatments / Results  Labs (all labs ordered are listed, but only abnormal results are displayed) Labs Reviewed - No data to display  EKG None  Radiology No results found.  Procedures Procedures (including critical care time)  Medications Ordered in ED Medications  albuterol (PROVENTIL  HFA;VENTOLIN HFA) 108 (90 Base) MCG/ACT inhaler 2 puff (has no administration in time range)     Initial Impression / Assessment and Plan / ED Course  I have reviewed the triage vital signs and the nursing notes.  Pertinent labs & imaging results that were available during my care of the patient were reviewed by me and considered in my medical decision making (see chart for details).     33 y.o. male here for medication refill, ran out of his inhaler yesterday and  his nebulizer solution today.  He just got off work and decided to come for medication refill.  He has absolutely no complaints at this time.  On exam, clear lung exam, no wheezing, no hypoxia, no increased work of breathing.  Doubt need for emergent intervention or work up at this time.  Will provide courtesy refill of his inhaler and nebulizer, give inhaler here as well, but advised that he needs to follow-up with his PCP for future med refills.  Advised follow-up with them in 1 week for reassessment. I explained the diagnosis and have given explicit precautions to return to the ER including for any other new or worsening symptoms. The patient understands and accepts the medical plan as it's been dictated and I have answered their questions. Discharge instructions concerning home care and prescriptions have been given. The patient is STABLE and is discharged to home in good condition.    Final Clinical Impressions(s) / ED Diagnoses   Final diagnoses:  Encounter for medication refill    ED Discharge Orders         Ordered    albuterol (PROVENTIL HFA;VENTOLIN HFA) 108 (90 Base) MCG/ACT inhaler  Every 4 hours PRN     05/17/18 2146    albuterol (PROVENTIL) (2.5 MG/3ML) 0.083% nebulizer solution  Every 6 hours PRN     05/17/18 [address], [place], [place] 05/17/18 2149    Vanetta Mulders, MD 05/27/18 [phone]

## 2018-05-17 NOTE — Discharge Instructions (Signed)
You were provided a courtesy refill of your inhaler and nebulizer solution, but in the future you need to see your regular doctor for future medication refills.  Use your inhaler and nebulizer as directed, as needed for any shortness of breath, wheezing, cough, etc.  Take your usual home medications.  Follow-up with your regular doctor in 1 week for reassessment.  Return to the ER for emergent worsening or changing in symptoms.

## 2018-05-17 NOTE — ED Triage Notes (Signed)
Pt stated "Ran out of my inhaler yesterday and solution for the nebulizer this morning."

## 2018-05-17 NOTE — ED Notes (Signed)
Bed: [facility] Expected date:  Expected time:  Means of arrival:  Comments: 

## 2018-09-24 ENCOUNTER — Encounter (INDEPENDENT_AMBULATORY_CARE_PROVIDER_SITE_OTHER): Payer: Medicaid Other | Admitting: Ophthalmology

## 2018-09-24 ENCOUNTER — Other Ambulatory Visit: Payer: Self-pay

## 2018-09-24 DIAGNOSIS — H33302 Unspecified retinal break, left eye: Secondary | ICD-10-CM

## 2018-09-24 DIAGNOSIS — H43813 Vitreous degeneration, bilateral: Secondary | ICD-10-CM

## 2018-10-02 ENCOUNTER — Encounter (INDEPENDENT_AMBULATORY_CARE_PROVIDER_SITE_OTHER): Payer: Medicaid Other | Admitting: Ophthalmology

## 2018-10-09 ENCOUNTER — Encounter (INDEPENDENT_AMBULATORY_CARE_PROVIDER_SITE_OTHER): Payer: Medicaid Other | Admitting: Ophthalmology

## 2018-10-09 ENCOUNTER — Other Ambulatory Visit: Payer: Self-pay

## 2018-10-09 DIAGNOSIS — H33302 Unspecified retinal break, left eye: Secondary | ICD-10-CM

## 2019-02-09 ENCOUNTER — Encounter (INDEPENDENT_AMBULATORY_CARE_PROVIDER_SITE_OTHER): Payer: Medicaid Other | Admitting: Ophthalmology

## 2019-07-28 ENCOUNTER — Ambulatory Visit (HOSPITAL_COMMUNITY)
Admission: EM | Admit: 2019-07-28 | Discharge: 2019-07-28 | Disposition: A | Discharge status: Home / Self Care | Payer: Medicaid Other | Attending: Family Medicine | Admitting: Family Medicine

## 2019-07-28 ENCOUNTER — Other Ambulatory Visit: Payer: Self-pay

## 2019-07-28 ENCOUNTER — Encounter (HOSPITAL_COMMUNITY): Payer: Self-pay

## 2019-07-28 DIAGNOSIS — Z202 Contact with and (suspected) exposure to infections with a predominantly sexual mode of transmission: Secondary | ICD-10-CM

## 2019-07-28 DIAGNOSIS — R030 Elevated blood-pressure reading, without diagnosis of hypertension: Secondary | ICD-10-CM

## 2019-07-28 HISTORY — DX: Other seasonal allergic rhinitis: J30.2

## 2019-07-28 MED ORDER — AZITHROMYCIN 250 MG PO TABS
ORAL_TABLET | ORAL | Status: AC
  Filled 2019-07-28: qty 1

## 2019-07-28 MED ORDER — AZITHROMYCIN 250 MG PO TABS
1000.0000 mg | ORAL_TABLET | Freq: Once | ORAL | Status: AC
  Administered 2019-07-28: 16:00:00 1000 mg via ORAL

## 2019-07-28 NOTE — ED Triage Notes (Signed)
Pt states his sexual partner tested + for chlamydia. Pt denies symptoms at this time.

## 2019-07-28 NOTE — Discharge Instructions (Signed)
You have been given the following today for exposure to chlamydia:  Medications  azithromycin (ZITHROMAX) tablet 1,000 mg     Please refrain from all sexual activity for at least the next seven days.  Your blood pressure was noted to be elevated during your visit today. If you are currently taking medication for high blood pressure, please ensure you are taking this as directed. If you do not have a history of high blood pressure and your blood pressure remains persistently elevated, you may need to begin taking a medication at some point. You may return here within the next few days to recheck if unable to see your primary care provider or if do not have a one.  BP (!) 144/85   Pulse 80   Temp 98.2 F (36.8 C) (Oral)   Resp 16   Ht 5\' 6"  (1.676 m)   Wt 89.8 kg   SpO2 97%   BMI 31.96 kg/m

## 2019-07-28 NOTE — ED Provider Notes (Signed)
[facility]   [id number] 07/28/19 Arrival Time: 1501  ASSESSMENT & PLAN:  1. Exposure to STD   2. Elevated blood pressure reading without diagnosis of hypertension     Declines STI testing. Will treat for chlamydia.  Meds ordered this encounter  Medications  . azithromycin (ZITHROMAX) tablet 1,000 mg      Discharge Instructions      You have been given the following today for exposure to chlamydia:  Medications  azithromycin (ZITHROMAX) tablet 1,000 mg     Please refrain from all sexual activity for at least the next seven days.  Your blood pressure was noted to be elevated during your visit today. If you are currently taking medication for high blood pressure, please ensure you are taking this as directed. If you do not have a history of high blood pressure and your blood pressure remains persistently elevated, you may need to begin taking a medication at some point. You may return here within the next few days to recheck if unable to see your primary care provider or if do not have a one.  BP (!) 144/85   Pulse 80   Temp 98.2 F (36.8 C) (Oral)   Resp 16   Ht 5\' 6"  (1.676 m)   Wt 89.8 kg   SpO2 97%   BMI 31.96 kg/m       Recommend: Follow-up Information    , NP.   Specialty: Family Medicine Why: As needed. Contact information: [address] [place] [phone]          Reviewed expectations re: course of current medical issues. Questions answered. Outlined signs and symptoms indicating need for more acute intervention. Patient verbalized understanding. After Visit Summary given.  SUBJECTIVE: History from: patient. Maurice Koch is a 34 y.o. male who reports exposure to chlamydia. One male sexual partner; informed him that she tested + for chlamydia. He reports no symptoms including penile discharge. No urinary difficulties. Afebrile. No h/o STI reported. Desires treatment.  Past Surgical  History:  Procedure Laterality Date  . APPENDECTOMY  2003    Increased blood pressure noted today. Reports that he has not been treated for hypertension in the past.  He reports no chest pain on exertion, no dyspnea on exertion, no swelling of ankles, no orthostatic dizziness or lightheadedness, no orthopnea or paroxysmal nocturnal dyspnea and no palpitations.   OBJECTIVE:  Vitals:   07/28/19 1529  BP: (!) 144/85  Pulse: 80  Resp: 16  Temp: 98.2 F (36.8 C)  TempSrc: Oral  SpO2: 97%  Weight: 89.8 kg  Height: 5\' 6"  (1.676 m)    General appearance: alert; no distress HEENT: [place]; AT Neck: supple with FROM Resp: unlabored respirations CV: reg Abd: soft GU: deferrred Skin: warm and dry; no visible rashes Ext: no edema Psychological: alert and cooperative; normal mood and affect    Allergies  Allergen Reactions  . Shellfish Allergy Hives    Past Medical History:  Diagnosis Date  . Anxiety   . Asthma   . Depression   . Seasonal allergies    Social History   Socioeconomic History  . Marital status: Significant Other    Spouse name: Not on file  . Number of children: Not on file  . Years of education: Not on file  . Highest education level: Not on file  Occupational History  . Not on file  Tobacco Use  . Smoking status: Never Smoker  . Smokeless tobacco: Never Used  Substance and Sexual Activity  . Alcohol use: No  . Drug use: No  . Sexual activity: Not Currently  Other Topics Concern  . Not on file  Social History Narrative  . Not on file   Social Determinants of Health   Financial Resource Strain:   . Difficulty of Paying Living Expenses:   Food Insecurity:   . Worried About Charity fundraiser in the Last Year:   . Arboriculturist in the Last Year:   Transportation Needs:   . Film/video editor (Medical):   Marland Kitchen Lack of Transportation (Non-Medical):   Physical Activity:   . Days of Exercise per Week:   . Minutes of Exercise per Session:     Stress:   . Feeling of Stress :   Social Connections:   . Frequency of Communication with Friends and Family:   . Frequency of Social Gatherings with Friends and Family:   . Attends Religious Services:   . Active Member of Clubs or Organizations:   . Attends Archivist Meetings:   Marland Kitchen Marital Status:    Family History  Problem Relation Age of Onset  . Diabetes Other   . Asthma Other    Past Surgical History:  Procedure Laterality Date  . APPENDECTOMY  Larey Dresser, MD 07/31/19 [phone]

## 2019-12-29 ENCOUNTER — Encounter (HOSPITAL_COMMUNITY): Payer: Self-pay | Admitting: Emergency Medicine

## 2019-12-29 ENCOUNTER — Emergency Department (HOSPITAL_COMMUNITY): Payer: Managed Care, Other (non HMO)

## 2019-12-29 ENCOUNTER — Emergency Department (HOSPITAL_COMMUNITY)
Admission: EM | Admit: 2019-12-29 | Discharge: 2019-12-30 | Disposition: A | Discharge status: Home / Self Care | Payer: Managed Care, Other (non HMO) | Attending: Emergency Medicine | Admitting: Emergency Medicine

## 2019-12-29 ENCOUNTER — Other Ambulatory Visit: Payer: Self-pay

## 2019-12-29 DIAGNOSIS — J069 Acute upper respiratory infection, unspecified: Secondary | ICD-10-CM | POA: Diagnosis not present

## 2019-12-29 DIAGNOSIS — Z20822 Contact with and (suspected) exposure to covid-19: Secondary | ICD-10-CM | POA: Diagnosis not present

## 2019-12-29 DIAGNOSIS — R05 Cough: Secondary | ICD-10-CM | POA: Diagnosis present

## 2019-12-29 DIAGNOSIS — J45909 Unspecified asthma, uncomplicated: Secondary | ICD-10-CM | POA: Diagnosis not present

## 2019-12-29 LAB — SARS CORONAVIRUS 2 BY RT PCR (HOSPITAL ORDER, PERFORMED IN ~~LOC~~ HOSPITAL LAB): SARS Coronavirus 2: NEGATIVE

## 2019-12-29 MED ORDER — PREDNISONE 20 MG PO TABS
60.0000 mg | ORAL_TABLET | Freq: Once | ORAL | Status: AC
  Administered 2019-12-29: 60 mg via ORAL
  Filled 2019-12-29: qty 3

## 2019-12-29 MED ORDER — IPRATROPIUM BROMIDE HFA 17 MCG/ACT IN AERS
2.0000 | INHALATION_SPRAY | Freq: Once | RESPIRATORY_TRACT | Status: AC
  Administered 2019-12-29: 2 via RESPIRATORY_TRACT
  Filled 2019-12-29: qty 12.9

## 2019-12-29 MED ORDER — PREDNISONE 50 MG PO TABS: 50.0000 mg | ORAL_TABLET | Freq: Every day | ORAL | 0 refills | Status: AC

## 2019-12-29 MED ORDER — IPRATROPIUM-ALBUTEROL 0.5-2.5 (3) MG/3ML IN SOLN: 3.0000 mL | RESPIRATORY_TRACT | 0 refills | Status: AC | PRN

## 2019-12-29 MED ORDER — ALBUTEROL SULFATE HFA 108 (90 BASE) MCG/ACT IN AERS
2.0000 | INHALATION_SPRAY | Freq: Once | RESPIRATORY_TRACT | Status: AC
  Administered 2019-12-29: 2 via RESPIRATORY_TRACT
  Filled 2019-12-29: qty 6.7

## 2019-12-29 NOTE — ED Triage Notes (Signed)
Patient reports cough and congestion x3 days. Reports increased use of inhaler and nebulizer. Hx asthma.

## 2019-12-29 NOTE — Discharge Instructions (Addendum)
Take the steroids as prescribed.  Continue using your nebulizers at home.  Your covid test is pending, we suggest isolation at home until the test results. If positive you will need to be out of work or school for 10 days.  Return or new or worsening symptoms

## 2019-12-29 NOTE — ED Provider Notes (Signed)
[facility]-EMERGENCY DEPT Provider Note   CSN: [id number] Arrival date & time: 12/29/19  2105     History Chief Complaint  Patient presents with  . Nasal Congestion  . Cough    Maurice Koch is a 34 y.o. male with past medical history significant for asthma who presents for evaluation of cough and wheeze.  Patient states he has been having to use his albuterol nebulizers more frequently over the last 3 days.  No known sick contacts.  He is not vaccinated for Covid.  States he was admitted many years ago however is never required intubation for his asthma.  Cough nonproductive.  Has clear rhinorrhea and congestion.  Patient denies fever, chills, nausea, vomiting, chest pain, hemoptysis, abdominal pain diarrhea, dysuria, rashes, lesions, leg swelling, redness, warmth, history of PE, DVT, headache or lightheadedness.  Denies additional aggravating or alleviating factors.  History obtained from patient and past medical records.  No interpreter is used.  HPI     Past Medical History:  Diagnosis Date  . Anxiety   . Asthma   . Depression   . Seasonal allergies     Patient Active Problem List   Diagnosis Date Noted  . Influenza A 06/03/2017  . Asthma exacerbation 02/10/2017  . Hypokalemia 02/10/2017  . Conjunctivitis-right eye 02/10/2017  . Sepsis (HCC) 02/10/2017    Past Surgical History:  Procedure Laterality Date  . APPENDECTOMY  2003       Family History  Problem Relation Age of Onset  . Diabetes Other   . Asthma Other     Social History   Tobacco Use  . Smoking status: Never Smoker  . Smokeless tobacco: Never Used  Vaping Use  . Vaping Use: Never used  Substance Use Topics  . Alcohol use: No  . Drug use: No    Home Medications Prior to Admission medications   Medication Sig Start Date End Date Taking? Authorizing Provider  albuterol (PROVENTIL HFA;VENTOLIN HFA) 108 (90 Base) MCG/ACT inhaler Inhale 1-2 puffs into the lungs every  6 (six) hours as needed for wheezing or shortness of breath. 06/04/17   Standley BrookingGoodrich, Daniel P, MD  albuterol (PROVENTIL HFA;VENTOLIN HFA) 108 (90 Base) MCG/ACT inhaler Inhale 1-2 puffs into the lungs every 4 (four) hours as needed for wheezing or shortness of breath (or cough). 05/17/18   Street, LowryMercedes, PA-C  albuterol (PROVENTIL) (2.5 MG/3ML) 0.083% nebulizer solution Take 3 mLs (2.5 mg total) by nebulization every 6 (six) hours as needed for wheezing or shortness of breath. 08/17/14   Everlene Farrieransie, William, PA-C  albuterol (PROVENTIL) (2.5 MG/3ML) 0.083% nebulizer solution Take 3 mLs (2.5 mg total) by nebulization every 6 (six) hours as needed for wheezing or shortness of breath. 05/17/18   Street, Mercedes, PA-C  benzonatate (TESSALON) 100 MG capsule Take 1 capsule (100 mg total) by mouth every 8 (eight) hours. Patient not taking: Reported on 06/02/2017 02/06/17   Hedges, Tinnie GensJeffrey, PA-C  budesonide (PULMICORT) 0.25 MG/2ML nebulizer solution Take 0.25 mg by nebulization 3 (three) times daily.    [provider]  dicyclomine (BENTYL) 20 MG tablet Take 1 tablet (20 mg total) by mouth 2 (two) times daily as needed for spasms (only if not experiencing constipation). Patient not taking: Reported on 02/08/2017 02/07/15   Gavin PoundBrooten, Justin, MD  ipratropium (ATROVENT) 0.02 % nebulizer solution Take 2.5 mLs (0.5 mg total) every 6 (six) hours by nebulization. Patient taking differently: Take 0.5 mg by nebulization every 6 (six) hours as needed for wheezing  or shortness of breath.  02/11/17   Joseph Art, DO  ipratropium-albuterol (DUONEB) 0.5-2.5 (3) MG/3ML SOLN Take 3 mLs by nebulization every 4 (four) hours as needed. 12/29/19   Pratham Cassatt A, PA-C  predniSONE (DELTASONE) 50 MG tablet Take 1 tablet (50 mg total) by mouth daily. 12/29/19   Mohsin Crum A, PA-C  traZODone (DESYREL) 50 MG tablet Take 50 mg by mouth at bedtime.    [provider]  escitalopram (LEXAPRO) 20 MG tablet Take 20 mg by  mouth at bedtime.  01/30/17 07/28/19  [provider]  loratadine (CLARITIN) 10 MG tablet Take 10 mg at bedtime by mouth.  01/30/17 07/28/19  [provider]  montelukast (SINGULAIR) 10 MG tablet Take 10 mg by mouth at bedtime.  07/28/19  [provider]    Allergies    Shellfish allergy  Review of Systems   Review of Systems  Constitutional: Negative.   HENT: Positive for congestion, nosebleeds, postnasal drip and rhinorrhea. Negative for sinus pressure, sinus pain, sneezing, sore throat, trouble swallowing and voice change.   Respiratory: Positive for cough and wheezing.   Cardiovascular: Negative.   Gastrointestinal: Negative.   Genitourinary: Negative.   Musculoskeletal: Negative.   Skin: Negative.   Neurological: Negative.   All other systems reviewed and are negative.   Physical Exam Updated Vital Signs BP 135/86   Pulse 78   Temp 97.8 F (36.6 C) (Oral)   Resp 18   SpO2 98%   Physical Exam Vitals and nursing note reviewed.  Constitutional:      General: He is not in acute distress.    Appearance: He is well-developed. He is not ill-appearing, toxic-appearing or diaphoretic.  HENT:     Head: Normocephalic and atraumatic.     Nose: Nose normal.     Mouth/Throat:     Mouth: Mucous membranes are moist.  Eyes:     Pupils: Pupils are equal, round, and reactive to light.  Cardiovascular:     Rate and Rhythm: Normal rate and regular rhythm.     Pulses: Normal pulses.     Heart sounds: Normal heart sounds.  Pulmonary:     Effort: Pulmonary effort is normal. No respiratory distress.     Breath sounds: Wheezing present.  Abdominal:     General: Bowel sounds are normal. There is no distension.     Palpations: Abdomen is soft.     Tenderness: There is no abdominal tenderness. There is no guarding or rebound.  Musculoskeletal:        General: Normal range of motion.     Cervical back: Normal range of motion and neck supple.     Comments: Moves  all 4 extremities without difficulty. Compartments soft. Homans sign negative.  Skin:    General: Skin is warm and dry.     Capillary Refill: Capillary refill takes less than 2 seconds.     Comments: No edema, erythema, warmth, fluctuance or induration  Neurological:     General: No focal deficit present.     Mental Status: He is alert and oriented to person, place, and time.     ED Results / Procedures / Treatments   Labs (all labs ordered are listed, but only abnormal results are displayed) Labs Reviewed  SARS CORONAVIRUS 2 BY RT PCR [facility] ORDER, PERFORMED IN [facility])    EKG EKG Interpretation  Date/Time:  Tuesday December 29 2019 22:08:23 EDT Ventricular Rate:  73 PR Interval:  QRS Duration: 106 QT Interval:  403 QTC Calculation: 445 R Axis:   -36 Text Interpretation: Sinus rhythm Left axis deviation Minimal ST elevation, anterior leads similar to prior EKGs Confirmed by Rolan Bucco [phone]) on 12/29/2019 10:17:51 PM   Radiology DG Chest Portable 1 View  Result Date: 12/29/2019 CLINICAL DATA:  Dyspnea, cough EXAM: PORTABLE CHEST 1 VIEW COMPARISON:  06/02/2017 FINDINGS: The heart size and mediastinal contours are within normal limits. Both lungs are clear. The visualized skeletal structures are unremarkable. IMPRESSION: No active disease. Electronically Signed   By: Helyn Numbers MD   On: 12/29/2019 22:08    Procedures Procedures (including critical care time)  Medications Ordered in ED Medications  albuterol (VENTOLIN HFA) 108 (90 Base) MCG/ACT inhaler 2 puff (2 puffs Inhalation Given 12/29/19 2218)  ipratropium (ATROVENT HFA) inhaler 2 puff (2 puffs Inhalation Given 12/29/19 2218)  predniSONE (DELTASONE) tablet 60 mg (60 mg Oral Given 12/29/19 2218)    ED Course  I have reviewed the triage vital signs and the nursing notes.  Pertinent labs & imaging results that were available during my care of the patient were reviewed by me and considered  in my medical decision making (see chart for details).  34 year old male presents for evaluation of cough, shortness of breath and wheezing.  Afebrile, nonseptic, non-ill-appearing.  History of asthma however his not needed prior intubations however as needed prior admissions "years ago."  He does have some congestion and rhinorrhea as well as nonproductive cough.  Intermittent shortness of breath.  No chest pain.  Does have mild expiratory wheeze however no acute respiratory distress.  Productive cough on exam.  He is not Covid vaccinated.  His abdomen is soft, nontender.  His nonfocal neuro exam without deficits.  His compartments are soft.  No clinical evidence of DVT on exam.  Plan on Covid test, chest x-ray, EKG, steroids and albuterol and reassess  Patient reassessed. Still has continued wheeze however improved. Patient given additional Atrovent and Albuterol in room.  Patient without any tachycardia, tachypnea or hypoxia.  PERC negative, Wells criteria low risk  Chest x-ray without any acute infiltrates, cardiomegaly, pleural edema, pneumothorax EKG without STEMI  Patient reassessed.  Breathing is at baseline.  Wheezing has significantly improved.  He is ambulatory telemetry hypoxia or tachypnea.  Refilled his home nebulizers.  His Covid test is pending at dc home.  He understands that he will get a phone call if this is positive.  Discussed with patient if he is Covid positive he would be eligible for MAB treatment.   Patient ambulated in ED with O2 saturations maintained >90, no current signs of respiratory distress. Prednisone given in the ED and pt will be discharged with 5 day burst. Pt states they are breathing at baseline. Pt has been instructed to continue using prescribed medications and to speak with PCP about today's exacerbation. Low suspicion for atypical ACS, PE, dissection, bacterial pneumonia, pneumomediastinum as cause of patient's shortness of breath.  Likely asthma exacerbation  aggravated by his viral upper respiratory infection.  The patient has been appropriately medically screened and/or stabilized in the ED. I have low suspicion for any other emergent medical condition which would require further screening, evaluation or treatment in the ED or require inpatient management.  Patient is hemodynamically stable and in no acute distress.  Patient able to ambulate in department prior to ED.  Evaluation does not show acute pathology that would require ongoing or additional emergent interventions while in the emergency department or  further inpatient treatment.  I have discussed the diagnosis with the patient and answered all questions.  Pain is been managed while in the emergency department and patient has no further complaints prior to discharge.  Patient is comfortable with plan discussed in room and is stable for discharge at this time.  I have discussed strict return precautions for returning to the emergency department.  Patient was encouraged to follow-up with PCP/specialist refer to at discharge.    MDM Rules/Calculators/A&P                          Maurice Koch was evaluated in Emergency Department on 12/29/2019 for the symptoms described in the history of present illness. He was evaluated in the context of the global COVID-19 pandemic, which necessitated consideration that the patient might be at risk for infection with the SARS-CoV-2 virus that causes COVID-19. Institutional protocols and algorithms that pertain to the evaluation of patients at risk for COVID-19 are in a state of rapid change based on information released by regulatory bodies including the CDC and federal and state organizations. These policies and algorithms were followed during the patient's care in the ED. Final Clinical Impression(s) / ED Diagnoses Final diagnoses:  Viral URI with cough  Person under investigation for COVID-19    Rx / DC Orders ED Discharge Orders         Ordered     predniSONE (DELTASONE) 50 MG tablet  Daily        12/29/19 2318    ipratropium-albuterol (DUONEB) 0.5-2.5 (3) MG/3ML SOLN  Every 4 hours PRN        12/29/19 2318           Nasier Thumm A, PA-C 12/29/19 2340    Rolan Bucco, MD 12/30/19 1621

## 2020-08-19 ENCOUNTER — Emergency Department (HOSPITAL_COMMUNITY): Payer: Medicaid Other

## 2020-08-19 ENCOUNTER — Encounter (HOSPITAL_COMMUNITY): Payer: Self-pay

## 2020-08-19 ENCOUNTER — Other Ambulatory Visit: Payer: Self-pay

## 2020-08-19 ENCOUNTER — Emergency Department (HOSPITAL_COMMUNITY)
Admission: EM | Admit: 2020-08-19 | Discharge: 2020-08-19 | Disposition: A | Discharge status: Home / Self Care | Payer: Medicaid Other | Attending: Emergency Medicine | Admitting: Emergency Medicine

## 2020-08-19 DIAGNOSIS — Y92410 Unspecified street and highway as the place of occurrence of the external cause: Secondary | ICD-10-CM | POA: Diagnosis not present

## 2020-08-19 DIAGNOSIS — J45909 Unspecified asthma, uncomplicated: Secondary | ICD-10-CM | POA: Diagnosis not present

## 2020-08-19 DIAGNOSIS — M546 Pain in thoracic spine: Secondary | ICD-10-CM | POA: Insufficient documentation

## 2020-08-19 DIAGNOSIS — R0781 Pleurodynia: Secondary | ICD-10-CM | POA: Insufficient documentation

## 2020-08-19 MED ORDER — ALBUTEROL SULFATE HFA 108 (90 BASE) MCG/ACT IN AERS: 1.0000 | INHALATION_SPRAY | Freq: Four times a day (QID) | RESPIRATORY_TRACT | 1 refills | Status: AC | PRN

## 2020-08-19 MED ORDER — HYDROCODONE-ACETAMINOPHEN 5-325 MG PO TABS
2.0000 | ORAL_TABLET | Freq: Once | ORAL | Status: AC
  Administered 2020-08-19: 2 via ORAL
  Filled 2020-08-19: qty 2
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
## 2020-08-19 NOTE — Discharge Instructions (Signed)
Return for any problem.   Take Ibuprofen (Motrin) - 600 mg orally every 8 hours - for pain.

## 2020-10-24 ENCOUNTER — Emergency Department (HOSPITAL_COMMUNITY): Payer: Medicaid Other

## 2020-10-24 ENCOUNTER — Encounter (HOSPITAL_COMMUNITY): Payer: Self-pay

## 2020-10-24 ENCOUNTER — Emergency Department (HOSPITAL_COMMUNITY)
Admission: EM | Admit: 2020-10-24 | Discharge: 2020-10-24 | Disposition: A | Discharge status: Home / Self Care | Payer: Medicaid Other | Attending: Emergency Medicine | Admitting: Emergency Medicine

## 2020-10-24 DIAGNOSIS — W208XXA Other cause of strike by thrown, projected or falling object, initial encounter: Secondary | ICD-10-CM | POA: Insufficient documentation

## 2020-10-24 DIAGNOSIS — J45909 Unspecified asthma, uncomplicated: Secondary | ICD-10-CM | POA: Diagnosis not present

## 2020-10-24 DIAGNOSIS — M79642 Pain in left hand: Secondary | ICD-10-CM | POA: Diagnosis present

## 2020-10-24 MED ORDER — OXYCODONE-ACETAMINOPHEN 5-325 MG PO TABS
1.0000 | ORAL_TABLET | Freq: Once | ORAL | Status: AC
  Administered 2020-10-24: 1 via ORAL
  Filled 2020-10-24: qty 1

## 2020-10-24 NOTE — ED Provider Notes (Signed)
[facility] Provider Note   CSN: [id number] Arrival date & time: 10/24/20  7846    History Chief Complaint  Patient presents with   Hand Injury    Maurice Koch is a 34 y.o. male with no significant past medical record who presents for evaluation of left hand pain. Began 3 hours PTA, Was at work when a 25-30 pound weight fell onto the dorsum of his left hand. Pain since. Pain mainly located at the first and second metacarpal. No numbness, weakness. Some mild swelling since the injury. Has not taken anything for pain since. No decreased ROM to wrist. No forearm pain. No bleeding cuts in skin. Rates pain a 7/10. No fever, chills, numbness,w weakness, lacerations. Denies additional aggrieving for elevating factors.  History obtained from patient and past medical records. No interpretor was used.  HPI     Past Medical History:  Diagnosis Date   Anxiety    Asthma    Depression    Seasonal allergies     Patient Active Problem List   Diagnosis Date Noted   Influenza A 06/03/2017   Asthma exacerbation 02/10/2017   Hypokalemia 02/10/2017   Conjunctivitis-right eye 02/10/2017   Sepsis (HCC) 02/10/2017    Past Surgical History:  Procedure Laterality Date   APPENDECTOMY  2003       Family History  Problem Relation Age of Onset   Diabetes Other    Asthma Other     Social History   Tobacco Use   Smoking status: Never   Smokeless tobacco: Never  Vaping Use   Vaping Use: Never used  Substance Use Topics   Alcohol use: No   Drug use: No    Home Medications Prior to Admission medications   Medication Sig Start Date End Date Taking? Authorizing Provider  albuterol (PROVENTIL HFA;VENTOLIN HFA) 108 (90 Base) MCG/ACT inhaler Inhale 1-2 puffs into the lungs every 6 (six) hours as needed for wheezing or shortness of breath. 06/04/17   Standley Brooking, MD  albuterol (PROVENTIL HFA;VENTOLIN HFA) 108 (90 Base) MCG/ACT inhaler Inhale 1-2  puffs into the lungs every 4 (four) hours as needed for wheezing or shortness of breath (or cough). 05/17/18   Street, [place], PA-C  albuterol (PROVENTIL) (2.5 MG/3ML) 0.083% nebulizer solution Take 3 mLs (2.5 mg total) by nebulization every 6 (six) hours as needed for wheezing or shortness of breath. 08/17/14   Everlene Farrier, PA-C  albuterol (PROVENTIL) (2.5 MG/3ML) 0.083% nebulizer solution Take 3 mLs (2.5 mg total) by nebulization every 6 (six) hours as needed for wheezing or shortness of breath. 05/17/18   Street, [place], PA-C  albuterol (VENTOLIN HFA) 108 (90 Base) MCG/ACT inhaler Inhale 1-2 puffs into the lungs every 6 (six) hours as needed for wheezing or shortness of breath. 08/19/20   Wynetta Fines, MD  benzonatate (TESSALON) 100 MG capsule Take 1 capsule (100 mg total) by mouth every 8 (eight) hours. Patient not taking: Reported on 06/02/2017 02/06/17   Hedges, Tinnie Gens, PA-C  budesonide (PULMICORT) 0.25 MG/2ML nebulizer solution Take 0.25 mg by nebulization 3 (three) times daily.    [provider]  dicyclomine (BENTYL) 20 MG tablet Take 1 tablet (20 mg total) by mouth 2 (two) times daily as needed for spasms (only if not experiencing constipation). Patient not taking: Reported on 02/08/2017 02/07/15   Gavin Pound, MD  ipratropium (ATROVENT) 0.02 % nebulizer solution Take 2.5 mLs (0.5 mg total) every 6 (six) hours by nebulization. Patient taking differently: Take  0.5 mg by nebulization every 6 (six) hours as needed for wheezing or shortness of breath.  02/11/17   Joseph Art, DO  ipratropium-albuterol (DUONEB) 0.5-2.5 (3) MG/3ML SOLN Take 3 mLs by nebulization every 4 (four) hours as needed. 12/29/19   Cybele Maule A, PA-C  predniSONE (DELTASONE) 50 MG tablet Take 1 tablet (50 mg total) by mouth daily. 12/29/19   Sayid Moll A, PA-C  traZODone (DESYREL) 50 MG tablet Take 50 mg by mouth at bedtime.    [provider]  escitalopram (LEXAPRO) 20 MG tablet Take 20  mg by mouth at bedtime.  01/30/17 07/28/19  [provider]  loratadine (CLARITIN) 10 MG tablet Take 10 mg at bedtime by mouth.  01/30/17 07/28/19  [provider]  montelukast (SINGULAIR) 10 MG tablet Take 10 mg by mouth at bedtime.  07/28/19  [provider]    Allergies    Shellfish allergy  Review of Systems   Review of Systems  Constitutional: Negative.   HENT: Negative.    Respiratory: Negative.    Cardiovascular: Negative.   Gastrointestinal: Negative.   Genitourinary: Negative.   Musculoskeletal:        Pain to left hand  Skin: Negative.   Neurological: Negative.   All other systems reviewed and are negative.  Physical Exam Updated Vital Signs BP (!) 147/91 (BP Location: Left Arm)   Pulse 72   Temp 98.2 F (36.8 C) (Oral)   Resp 18   Ht 5\' 3"  (1.6 m)   Wt 86.2 kg   SpO2 99%   BMI 33.66 kg/m   Physical Exam Vitals and nursing note reviewed.  Constitutional:      General: He is not in acute distress.    Appearance: He is well-developed. He is not ill-appearing, toxic-appearing or diaphoretic.  HENT:     Head: Normocephalic and atraumatic.     Nose: Nose normal.     Mouth/Throat:     Mouth: Mucous membranes are moist.  Eyes:     Pupils: Pupils are equal, round, and reactive to light.  Cardiovascular:     Rate and Rhythm: Normal rate and regular rhythm.     Pulses: Normal pulses.     Heart sounds: Normal heart sounds.  Pulmonary:     Effort: Pulmonary effort is normal. No respiratory distress.     Breath sounds: Normal breath sounds.  Abdominal:     General: Bowel sounds are normal. There is no distension.     Palpations: Abdomen is soft.  Musculoskeletal:        General: Normal range of motion.     Right hand: Normal.     Left hand: Swelling and tenderness present. No deformity or lacerations. Normal strength. Normal sensation. Normal capillary refill. Normal pulse.       Hands:     Cervical back: Normal range of motion and  neck supple.     Comments: Diffuse tenderness to left dorsum hand, primarily over 1-2 metacarpal. No tenderness to forearm. Wiggles fingers without difficulty. No nail bed injury  Skin:    General: Skin is warm and dry.     Comments: Mild soft tissue swelling to left dorsum hand. Compartments soft. No ecchymosis, erythema, warmth. No laceration, breaks in skin  Neurological:     General: No focal deficit present.     Mental Status: He is alert and oriented to person, place, and time.     Comments: Intact sensation Mild decrease in hand grip on left  ED Results / Procedures / Treatments   Labs (all labs ordered are listed, but only abnormal results are displayed) Labs Reviewed - No data to display  EKG None  Radiology DG Hand Complete Left  Result Date: 10/24/2020 CLINICAL DATA:  Left thumb pain after injury at work. EXAM: LEFT HAND - COMPLETE 3+ VIEW COMPARISON:  None. FINDINGS: There is no evidence of fracture or dislocation. There is no evidence of arthropathy or other focal bone abnormality. Soft tissues are unremarkable. IMPRESSION: Negative. Electronically Signed   By: Lupita RaiderJames  Green Jr M.D.   On: 10/24/2020 07:30    Procedures .Splint Application  Date/Time: 10/24/2020 7:58 AM Performed by: Linwood DibblesHenderly, Deyani Hegarty A, PA-C Authorized by: Linwood DibblesHenderly, Pearl Berlinger A, PA-C   Consent:    Consent obtained:  Verbal   Consent given by:  Patient   Risks, benefits, and alternatives were discussed: yes     Risks discussed:  Discoloration, numbness, pain and swelling   Alternatives discussed:  No treatment, delayed treatment, alternative treatment, observation and referral Universal protocol:    Procedure explained and questions answered to patient or proxy's satisfaction: yes     Relevant documents present and verified: yes     Test results available: yes     Imaging studies available: yes     Required blood products, implants, devices, and special equipment available: yes     Site/side  marked: yes     Immediately prior to procedure a time out was called: yes     Patient identity confirmed:  Verbally with patient Pre-procedure details:    Distal neurologic exam:  Normal   Distal perfusion: distal pulses strong and brisk capillary refill   Procedure details:    Location:  Hand   Hand location:  L hand   Strapping: no     Cast type:  Short arm   Attestation: Splint applied and adjusted personally by me   Post-procedure details:    Distal neurologic exam:  Normal   Distal perfusion: distal pulses strong and brisk capillary refill     Procedure completion:  Tolerated well, no immediate complications   Post-procedure imaging: not applicable     Medications Ordered in ED Medications  oxyCODONE-acetaminophen (PERCOCET/ROXICET) 5-325 MG per tablet 1 tablet (1 tablet Oral Given 10/24/20 [id number])   ED Course  I have reviewed the triage vital signs and the nursing notes.  Pertinent labs & imaging results that were available during my care of the patient were reviewed by me and considered in my medical decision making (see chart for details).  Here for evaluation of left hand pain after 25 pound weight fell onto hand 3 hours PTA. NV intact. Compartments soft. Tenderness over dorsum left hand greater over 1-2 metacarpal. Mild soft tissue swelling. Mild decrease on hand grip on left likely due to pain. No current evidence of compartment syndrome. No nail bed injuries. No laceration or breask in skin. Xray here personally reviewed and interpreted does not show fracture, dislocation. Given location of pain will place in thumb spica splint. RICE for symptomatic management. FU with PCP, Ortho if symptoms do not resolve.  The patient has been appropriately medically screened and/or stabilized in the ED. I have low suspicion for any other emergent medical condition which would require further screening, evaluation or treatment in the ED or require inpatient management.  Patient is  hemodynamically stable and in no acute distress.  Patient able to ambulate in department prior to ED.  Evaluation does not show acute pathology that  would require ongoing or additional emergent interventions while in the emergency department or further inpatient treatment.  I have discussed the diagnosis with the patient and answered all questions.  Pain is been managed while in the emergency department and patient has no further complaints prior to discharge.  Patient is comfortable with plan discussed in room and is stable for discharge at this time.  I have discussed strict return precautions for returning to the emergency department.  Patient was encouraged to follow-up with PCP/specialist refer to at discharge.     MDM Rules/Calculators/A&P                           Final Clinical Impression(s) / ED Diagnoses Final diagnoses:  Pain of left hand    Rx / DC Orders ED Discharge Orders     None        Kaziyah Parkison A, PA-C 10/24/20 0758    Rolan Bucco, MD 10/24/20 1056

## 2020-10-24 NOTE — Discharge Instructions (Addendum)
May take Tylenol and Ibuprofen for pain. Make sure to elevate and keep ice to hand. Keep brace on over the next week until you follow up with Primary care or orthopedics.  We do not do workmens comp or work required drug screens here in the ED as they require a special certification. Touch base with work to follow up on this.  I have placed the number to an Orthopedics on your discharge paper work. You may follow up with them as needed

## 2020-10-24 NOTE — ED Triage Notes (Signed)
Patient arrived with complaints of left hand pain after dropping a 35 lb weight on it about two hours prior to arrival.

## 2020-10-24 NOTE — ED Notes (Signed)
Brace applied to left wrist. ?
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
